# Patient Record
Sex: Male | Born: 1985 | Race: Black or African American | Hispanic: No | Marital: Single | Smoking: Former smoker
Health system: Southern US, Community
[De-identification: ages and names within clinical notes are randomized; demographics above are authoritative.]

## PROBLEM LIST (undated history)

## (undated) DIAGNOSIS — I10 Essential (primary) hypertension: Secondary | ICD-10-CM

## (undated) HISTORY — DX: Essential (primary) hypertension: I10

---
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
## 2007-11-25 ENCOUNTER — Emergency Department (HOSPITAL_COMMUNITY): Admission: EM | Admit: 2007-11-25 | Discharge: 2007-11-25 | Payer: Self-pay | Admitting: Emergency Medicine

## 2007-12-11 ENCOUNTER — Ambulatory Visit: Payer: Self-pay | Admitting: Cardiology

## 2007-12-22 ENCOUNTER — Encounter: Payer: Self-pay | Admitting: Cardiology

## 2007-12-22 ENCOUNTER — Ambulatory Visit: Payer: Self-pay

## 2008-08-30 ENCOUNTER — Emergency Department (HOSPITAL_COMMUNITY): Admission: EM | Admit: 2008-08-30 | Discharge: 2008-08-30 | Payer: Self-pay | Admitting: Emergency Medicine

## 2008-08-31 ENCOUNTER — Telehealth: Payer: Self-pay | Admitting: *Deleted

## 2008-10-18 ENCOUNTER — Emergency Department (HOSPITAL_COMMUNITY): Admission: EM | Admit: 2008-10-18 | Discharge: 2008-10-19 | Payer: Self-pay | Admitting: Emergency Medicine

## 2010-12-26 LAB — CBC
HCT: 44.6 % (ref 39.0–52.0)
Hemoglobin: 15.1 g/dL (ref 13.0–17.0)
MCHC: 34 g/dL (ref 30.0–36.0)
MCV: 93.5 fL (ref 78.0–100.0)
RDW: 12.7 % (ref 11.5–15.5)

## 2010-12-26 LAB — DIFFERENTIAL
Lymphs Abs: 0.4 10*3/uL — ABNORMAL LOW (ref 0.7–4.0)
Monocytes Relative: 4 % (ref 3–12)
Neutro Abs: 8.2 10*3/uL — ABNORMAL HIGH (ref 1.7–7.7)
Neutrophils Relative %: 92 % — ABNORMAL HIGH (ref 43–77)

## 2010-12-26 LAB — COMPREHENSIVE METABOLIC PANEL
BUN: 6 mg/dL (ref 6–23)
Calcium: 9.7 mg/dL (ref 8.4–10.5)
Creatinine, Ser: 0.79 mg/dL (ref 0.4–1.5)
GFR calc non Af Amer: >60 mL/min (ref >60)
Glucose, Bld: 98 mg/dL (ref 70–99)
Total Protein: 7 g/dL (ref 6.0–8.3)

## 2010-12-26 LAB — URINALYSIS, ROUTINE W REFLEX MICROSCOPIC
Ketones, ur: 15 mg/dL — AB
Leukocytes, UA: NEGATIVE
Nitrite: NEGATIVE
Protein, ur: NEGATIVE mg/dL
Urobilinogen, UA: 1 mg/dL (ref 0.0–1.0)
pH: 8 (ref 5.0–8.0)

## 2010-12-26 LAB — URINE MICROSCOPIC-ADD ON

## 2011-01-23 NOTE — Assessment & Plan Note (Signed)
[facility]                            CARDIOLOGY OFFICE NOTE   NAME:[name], [name]                    MRN:          [id number]  DATE:12/11/2007                            DOB:          1986/04/08    I was asked by Dr. Cathren Laine to consult on [name], a 25 year old  single black male, for chest pain and tachy palpitations.  He comes  today with his father.   He has had no previous cardiac history.  He developed a rapid heartbeat  after eating angry recently.  He then had some chest tightness  associated with it.   This happened the day that he was seen in the emergency room at [facility] a second time.  This was on November 25, 2007.  At this time, he had  no emotional issues going on.   In the emergency room, his vital signs were stable.  He had a negative  chest x-ray.  Negative drug screen, except for positive marijuana.  He  was referred to [place] for followup.   He used to play a quite a bit of ball and had no problems keeping up  with his peers.  He occasionally works out and has no shortness of  breath, tachy palpitations, presyncope, syncope, or chest pain.  He has  never been told he has a murmur.  There is no history of sudden cardiac  death in his family.   PAST MEDICAL HISTORY:  He has no history of dye allergies.  He is on no  medications.  He smokes about 6 cigarettes a day.  He denies any use of  any recreational drugs other than some occasional marijuana.  He does  not drink alcohol.   He has had no surgical history.   FAMILY HISTORY:  Really negative for premature coronary disease or  sudden cardiac death.   SOCIAL HISTORY:  He is unemployed.  He is single.  He comes with his dad  today.   REVIEW OF SYSTEMS:  Negative other than the HPI.  All systems were  reviewed.   PHYSICAL EXAMINATION:  VITAL SIGNS:  His blood pressure is 100/58, his  pulse is 57 and regular.  His EKG shows sinus bradycardia and  significant  sinus arrhythmia.  You can also hear this on auscultation.  His PR is 160 msec, QRS is 96 msec, QTC is 493 msec.  His height is 5  feet 8, and he weighs 144 pounds.  HEENT:  Normocephalic, atraumatic.  PERRL.  Extraocular movements  intact.  Sclerae are clear.  Facial symmetry is normal.  Dentition is  satisfactory.  NECK:  Supple.  Carotids upstrokes were equal bilaterally, without  bruits, no JVD.  Thyroid is not enlarged.  Trachea is midline.  LUNGS:  Clear.  HEART:  Reveals a nondisplaced PMI.  He has normal S1, S2.  He is thin  chested, so he has loud heart sounds.  S2 splits physiologically.  There  was no significant murmur appreciated.  ABDOMEN:  Soft.  Good bowel sounds.  No midline bruit.  No hepatomegaly.  EXTREMITIES:  No cyanosis, clubbing, or edema.  Pulses are intact.  NEUROLOGIC:  Intact.  SKIN:  Unremarkable.   ASSESSMENT AND PLAN:  I suspect [name] has had some sinus  tachycardia or some PACs associated with his two episodes.  His chest  discomfort was probably associated with this as well.  We need to rule  out hypertrophic obstructive cardiomyopathy or any other structural  heart disease.   We will obtain a 2D echocardiogram, and if this is negative or normal,  reassurance will be given.  I do not think any specific pharmacological  therapy needs to be given.     Thomas C. Daleen Squibb, MD, [facility]  Electronically Signed    TCW/MedQ  DD: 12/11/2007  DT: 12/12/2007  Job #: [id number]   cc:   Trudi Ida. Denton Lank, M.D.

## 2011-06-04 LAB — RAPID URINE DRUG SCREEN, HOSP PERFORMED
Amphetamines: NOT DETECTED
Barbiturates: NOT DETECTED
Cocaine: NOT DETECTED
Opiates: NOT DETECTED
Tetrahydrocannabinol: POSITIVE — AB

## 2012-03-28 ENCOUNTER — Emergency Department (HOSPITAL_COMMUNITY)
Admission: EM | Admit: 2012-03-28 | Discharge: 2012-03-28 | Disposition: A | Discharge status: Home / Self Care | Payer: Self-pay | Attending: Emergency Medicine | Admitting: Emergency Medicine

## 2012-03-28 ENCOUNTER — Encounter (HOSPITAL_COMMUNITY): Payer: Self-pay

## 2012-03-28 DIAGNOSIS — R109 Unspecified abdominal pain: Secondary | ICD-10-CM | POA: Insufficient documentation

## 2012-03-28 DIAGNOSIS — R1110 Vomiting, unspecified: Secondary | ICD-10-CM | POA: Insufficient documentation

## 2012-03-28 NOTE — ED Notes (Signed)
Pt here with abd pain after drinking last night, sts vomiting all day

## 2012-03-28 NOTE — ED Notes (Signed)
Chart check done, vitals rechecked

## 2014-03-19 ENCOUNTER — Emergency Department (HOSPITAL_COMMUNITY): Payer: Self-pay

## 2014-03-19 ENCOUNTER — Encounter (HOSPITAL_COMMUNITY): Payer: Self-pay | Admitting: Emergency Medicine

## 2014-03-19 ENCOUNTER — Emergency Department (HOSPITAL_COMMUNITY)
Admission: EM | Admit: 2014-03-19 | Discharge: 2014-03-19 | Disposition: A | Discharge status: Home / Self Care | Payer: Self-pay | Attending: Emergency Medicine | Admitting: Emergency Medicine

## 2014-03-19 DIAGNOSIS — F17200 Nicotine dependence, unspecified, uncomplicated: Secondary | ICD-10-CM | POA: Insufficient documentation

## 2014-03-19 DIAGNOSIS — R51 Headache: Secondary | ICD-10-CM | POA: Insufficient documentation

## 2014-03-19 DIAGNOSIS — L02419 Cutaneous abscess of limb, unspecified: Secondary | ICD-10-CM | POA: Insufficient documentation

## 2014-03-19 DIAGNOSIS — L03116 Cellulitis of left lower limb: Secondary | ICD-10-CM

## 2014-03-19 DIAGNOSIS — L03119 Cellulitis of unspecified part of limb: Principal | ICD-10-CM

## 2014-03-19 LAB — CBC WITH DIFFERENTIAL/PLATELET
Basophils Absolute: 0 10*3/uL (ref 0.0–0.1)
Basophils Relative: 0 % (ref 0–1)
EOS PCT: 0 % (ref 0–5)
Eosinophils Absolute: 0 10*3/uL (ref 0.0–0.7)
HCT: 39.4 % (ref 39.0–52.0)
HEMOGLOBIN: 13.4 g/dL (ref 13.0–17.0)
LYMPHS ABS: 1.2 10*3/uL (ref 0.7–4.0)
LYMPHS PCT: 7 % — AB (ref 12–46)
MCH: 30.9 pg (ref 26.0–34.0)
MCHC: 34 g/dL (ref 30.0–36.0)
MCV: 91 fL (ref 78.0–100.0)
Monocytes Absolute: 1.6 10*3/uL — ABNORMAL HIGH (ref 0.1–1.0)
Monocytes Relative: 9 % (ref 3–12)
NEUTROS PCT: 84 % — AB (ref 43–77)
Neutro Abs: 14.1 10*3/uL — ABNORMAL HIGH (ref 1.7–7.7)
PLATELETS: 196 10*3/uL (ref 150–400)
RBC: 4.33 MIL/uL (ref 4.22–5.81)
RDW: 12.7 % (ref 11.5–15.5)
WBC: 16.9 10*3/uL — AB (ref 4.0–10.5)

## 2014-03-19 LAB — BASIC METABOLIC PANEL
Anion gap: 13 (ref 5–15)
BUN: 7 mg/dL (ref 6–23)
CO2: 28 meq/L (ref 19–32)
Calcium: 10 mg/dL (ref 8.4–10.5)
Chloride: 99 mEq/L (ref 96–112)
Creatinine, Ser: 0.87 mg/dL (ref 0.50–1.35)
GFR calc Af Amer: >90 mL/min (ref >90)
GFR calc non Af Amer: >90 mL/min (ref >90)
GLUCOSE: 97 mg/dL (ref 70–99)
POTASSIUM: 3.8 meq/L (ref 3.7–5.3)
SODIUM: 140 meq/L (ref 137–147)

## 2014-03-19 MED ORDER — CEPHALEXIN 500 MG PO CAPS: 500.0000 mg | ORAL_CAPSULE | Freq: Four times a day (QID) | ORAL | Status: DC

## 2014-03-19 MED ORDER — LIDOCAINE HCL (PF) 1 % IJ SOLN: 5.0000 mL | Freq: Once | INTRAMUSCULAR | Status: DC

## 2014-03-19 MED ORDER — CEPHALEXIN 250 MG PO CAPS
500.0000 mg | ORAL_CAPSULE | Freq: Once | ORAL | Status: AC
  Administered 2014-03-19: 500 mg via ORAL
  Filled 2014-03-19: qty 2

## 2014-03-19 MED ORDER — DOXYCYCLINE HYCLATE 100 MG PO CAPS: 100.0000 mg | ORAL_CAPSULE | Freq: Two times a day (BID) | ORAL | Status: DC

## 2014-03-19 MED ORDER — OXYCODONE-ACETAMINOPHEN 5-325 MG PO TABS
2.0000 | ORAL_TABLET | Freq: Once | ORAL | Status: AC
  Administered 2014-03-19: 2 via ORAL
  Filled 2014-03-19 (×2): qty 2

## 2014-03-19 MED ORDER — DOXYCYCLINE HYCLATE 100 MG PO TABS
100.0000 mg | ORAL_TABLET | Freq: Once | ORAL | Status: AC
  Administered 2014-03-19: 100 mg via ORAL
  Filled 2014-03-19: qty 1

## 2014-03-19 MED ORDER — SULFAMETHOXAZOLE-TMP DS 800-160 MG PO TABS
2.0000 | ORAL_TABLET | Freq: Once | ORAL | Status: AC
Start: 2014-03-19 — End: 2014-03-19
  Administered 2014-03-19: 2 via ORAL
  Filled 2014-03-19: qty 2

## 2014-03-19 MED ORDER — OXYCODONE-ACETAMINOPHEN 5-325 MG PO TABS: 1.0000 | ORAL_TABLET | ORAL | Status: DC | PRN

## 2014-03-19 NOTE — ED Notes (Signed)
The tech cleaned and applied a dressing to the left sole of the foot. The tech has reported to the RN in charge.

## 2014-03-19 NOTE — Discharge Instructions (Signed)
Read the information below.  Use the prescribed medication as directed.  Please discuss all new medications with your pharmacist.  Do not take additional tylenol while taking the prescribed pain medication to avoid overdose.  You may return to the Emergency Department at any time for worsening condition or any new symptoms that concern you.  If you develop increased redness, swelling, pus draining from the wound, or fevers greater than 100.4 despite using the prescribed medication and/or ibuprofen, return to the ER immediately for a recheck.     Cellulitis Cellulitis is an infection of the skin and the tissue beneath it. The infected area is usually red and tender. Cellulitis occurs most often in the arms and lower legs.  CAUSES  Cellulitis is caused by bacteria that enter the skin through cracks or cuts in the skin. The most common types of bacteria that cause cellulitis are Staphylococcus and Streptococcus. SYMPTOMS   Redness and warmth.  Swelling.  Tenderness or pain.  Fever. DIAGNOSIS  Your caregiver can usually determine what is wrong based on a physical exam. Blood tests may also be done. TREATMENT  Treatment usually involves taking an antibiotic medicine. HOME CARE INSTRUCTIONS   Take your antibiotics as directed. Finish them even if you start to feel better.  Keep the infected arm or leg elevated to reduce swelling.  Apply a warm cloth to the affected area up to 4 times per day to relieve pain.  Only take over-the-counter or prescription medicines for pain, discomfort, or fever as directed by your caregiver.  Keep all follow-up appointments as directed by your caregiver. SEEK MEDICAL CARE IF:   You notice red streaks coming from the infected area.  Your red area gets larger or turns dark in color.  Your bone or joint underneath the infected area becomes painful after the skin has healed.  Your infection returns in the same area or another area.  You notice a swollen  bump in the infected area.  You develop new symptoms. SEEK IMMEDIATE MEDICAL CARE IF:   You have a fever.  You feel very sleepy.  You develop vomiting or diarrhea.  You have a general ill feeling (malaise) with muscle aches and pains. MAKE SURE YOU:   Understand these instructions.  Will watch your condition.  Will get help right away if you are not doing well or get worse. Document Released: 06/06/2005 Document Revised: 02/26/2012 Document Reviewed: 11/12/2011 [facility] Patient Information 2015 [place]Care, [place]LL[place]. This information is not intended to replace advice given to you by your health care provider. Make sure you discuss any questions you have with your health care provider.

## 2014-03-19 NOTE — ED Notes (Signed)
Declined W/C at D/C and was escorted to lobby by RN. 

## 2014-03-19 NOTE — ED Notes (Signed)
Patient states having pain in left leg for a few days. Pt states has blisters on both feet.

## 2014-03-19 NOTE — ED Provider Notes (Signed)
CSN: [id number]     Arrival date & time 03/19/14  1456 History  This chart was scribed for Eddie DredgeEmily Nechuma Boven, PA-C working with Glynn OctaveStephen Rancour, MD by Evon Slackerrance Branch, ED Scribe. This patient was seen in room TR11C/TR11C and the patient's care was started at 5:23 PM.     Chief Complaint  Patient presents with  . Leg Pain   Patient is a 28 y.o. male presenting with leg pain. The history is provided by the patient. No language interpreter was used.  Leg Pain Associated symptoms: fever    HPI Comments: [name] is a 28 y.o. male who presents to the Emergency Department complaining of left sided leg pain onset 2 days. Pain is over his anterior thigh.  Pain is 8/10 with walking. He states that there was no injury to the leg. He states he has associated fever and headache.  Has chronic problems with wounds on his feet that he "picks at" - noticed a red painful spot on his left foot 2-3 days ago. Pain is 8/10, constant, worse with palpation and ambulation. He states that the leg is also red in the thigh area that is new today. He states that he has applied Bengay with no relief. He states that he fell in the pool recently but didn't injure the area he is complaining about, hit the bottom of the pool with his left knee. He denies cough, dysuria, discharge, rhinorrhea, back pain, weakness or numbness of the extremities.  He was recently at the beach and a water park but denies known injury to his feet or stepping on anything.   He also complaining of abscess on his left foot. He states that is very painful, He states that he picks at his feet often. He staes there was no injury to the foot.   History reviewed. No pertinent past medical history. History reviewed. No pertinent past surgical history. History reviewed. No pertinent family history. History  Substance Use Topics  . Smoking status: Current Every Day Smoker  . Smokeless tobacco: Not on file  . Alcohol Use: Yes    Review of Systems   Constitutional: Positive for fever.  HENT: Negative for rhinorrhea and sore throat.   Respiratory: Negative for cough.   Genitourinary: Negative for dysuria and discharge.  Musculoskeletal: Positive for myalgias.  Neurological: Positive for headaches.  All other systems reviewed and are negative.  Allergies  Review of patient's allergies indicates no known allergies.  Home Medications   Prior to Admission medications   Medication Sig Start Date End Date Taking? Authorizing Provider  ibuprofen (ADVIL,MOTRIN) 800 MG tablet Take 800 mg by mouth every 8 (eight) hours as needed.   Yes Historical Provider, MD  Menthol, Topical Analgesic, (BENGAY EX) Apply 1 application topically daily as needed (leg pain).   Yes Historical Provider, MD   Triage Vitals: BP 124/67  Pulse 86  Temp(Src) 100.4 F (38 C) (Oral)  Ht 5\' 8"  (1.727 m)  Wt 165 lb (74.844 kg)  BMI 25.09 kg/m2  SpO2 100%  Physical Exam  Nursing note and vitals reviewed. Constitutional: He appears well-developed and well-nourished. No distress.  HENT:  Head: Normocephalic and atraumatic.  Neck: Neck supple.  Cardiovascular: Intact distal pulses.   Pulmonary/Chest: Effort normal.  Musculoskeletal:       Left lower leg: He exhibits no tenderness, no swelling and no edema.       Legs: Neurological: He is alert.  Skin: He is not diaphoretic.    ED Course  Procedures (including critical care time) DIAGNOSTIC STUDIES: Oxygen Saturation is 100% on RA, normal by my interpretation.    COORDINATION OF CARE:    Labs Review Labs Reviewed - No data to display  Imaging Review No results found.   EKG Interpretation None      7:42 PM Discussed pt with Dr Manus Gunning who will also see the patient.  INCISION AND DRAINAGE Performed by: [name] Consent: Verbal consent obtained. Risks and benefits: risks, benefits and alternatives were discussed Type: abscess  Body area: plantar aspect , left foot  Anesthesia: local  infiltration  Incision was made with a scalpel.  Local anesthetic: lidocaine 2% no epinephrine  Anesthetic total: 1 ml  Complexity: complex Blunt dissection to break up loculations  Drainage: bloody  Drainage amount: slight  Packing material: none  Patient tolerance: Patient tolerated the procedure well with no immediate complications.     MDM   Final diagnoses:  Cellulitis of left leg    Febrile but nontoxic patient with no known medication problems with apparent abscess over left foot with erythematous streak into medial ankle and erythema and tenderness over anterior thigh.  No calf tenderness.  WBC 16.9.  Fevers in ED.  Pt otherwise very healthy, young person.  Pt also seen by Dr Manus Gunning.  Pt prefers discharge home.  Will start keflex, doxycyline.  Pt will return to ED in two days for a recheck, sooner for worsening symptoms.  Discussed result, findings, treatment, and follow up  with patient.  Pt given return precautions.  Pt verbalizes understanding and agrees with plan.        I personally performed the services described in this documentation, which was scribed in my presence. The recorded information has been reviewed and is accurate.      Eddie Dredge, PA-C 03/19/14 2145

## 2014-03-20 NOTE — ED Provider Notes (Signed)
Medical screening examination/treatment/procedure(s) were conducted as a shared visit with non-physician practitioner(s) and myself.  I personally evaluated the patient during the encounter.  Erythema and induration to L medial foot. +2DP and PT pulse.  No erythema of lower leg, but streak of erythema to anterior thigh. D/w patient PO abx versus admission.  He wishes to try outpatient abx and recheck in 48 hours.   EKG Interpretation None       Glynn OctaveStephen Amarilis Belflower, MD 03/20/14 0105

## 2014-03-21 ENCOUNTER — Encounter (HOSPITAL_COMMUNITY): Payer: Self-pay | Admitting: Emergency Medicine

## 2014-03-21 ENCOUNTER — Emergency Department (HOSPITAL_COMMUNITY)
Admission: EM | Admit: 2014-03-21 | Discharge: 2014-03-21 | Disposition: A | Discharge status: Home / Self Care | Payer: Self-pay | Attending: Emergency Medicine | Admitting: Emergency Medicine

## 2014-03-21 DIAGNOSIS — Z5189 Encounter for other specified aftercare: Secondary | ICD-10-CM

## 2014-03-21 DIAGNOSIS — F17200 Nicotine dependence, unspecified, uncomplicated: Secondary | ICD-10-CM | POA: Insufficient documentation

## 2014-03-21 DIAGNOSIS — Z79899 Other long term (current) drug therapy: Secondary | ICD-10-CM | POA: Insufficient documentation

## 2014-03-21 DIAGNOSIS — Z4801 Encounter for change or removal of surgical wound dressing: Secondary | ICD-10-CM | POA: Insufficient documentation

## 2014-03-21 DIAGNOSIS — Z792 Long term (current) use of antibiotics: Secondary | ICD-10-CM | POA: Insufficient documentation

## 2014-03-21 NOTE — ED Notes (Signed)
Declined W/C at D/C and was escorted to lobby by RN. 

## 2014-03-21 NOTE — Discharge Instructions (Signed)
Continue antibiotics as directed and performing home wound care/dressing changes. Return to the ED for new or worsening symptoms.

## 2014-03-21 NOTE — ED Provider Notes (Signed)
CSN: [id number]     Arrival date & time 03/21/14  1200 History  This chart was scribed for non-physician practitioner, Sharilyn Sites, PA-C,working with Rolland Porter, MD, by Karle Plumber, ED Scribe.  This patient was seen in room TR11C/TR11C and the patient's care was started at 12:49 PM.  Chief Complaint  Patient presents with  . Wound Check   The history is provided by the patient. No language interpreter was used.   HPI Comments:  DAMEK ENDE is a 28 y.o. male who presents to the Emergency Department needing a recheck of an incision and drainage that was done here two days ago to his left medial foot. He reports mild pain while standing and subjective fever. He states the previous redness and streaking has resolved. He denies chills, or numbness of the foot.  States overall he is doing well.  History reviewed. No pertinent past medical history. History reviewed. No pertinent past surgical history. No family history on file. History  Substance Use Topics  . Smoking status: Current Every Day Smoker  . Smokeless tobacco: Not on file  . Alcohol Use: Yes    Review of Systems  Constitutional: Negative for chills.  Skin: Positive for wound.  Neurological: Negative for numbness.  All other systems reviewed and are negative.   Allergies  Review of patient's allergies indicates no known allergies.  Home Medications   Prior to Admission medications   Medication Sig Start Date End Date Taking? Authorizing Provider  cephALEXin (KEFLEX) 500 MG capsule Take 1 capsule (500 mg total) by mouth 4 (four) times daily. 03/19/14   Trixie Dredge, PA-C  doxycycline (VIBRAMYCIN) 100 MG capsule Take 1 capsule (100 mg total) by mouth 2 (two) times daily. One po bid x 7 days 03/19/14   Trixie Dredge, PA-C  ibuprofen (ADVIL,MOTRIN) 800 MG tablet Take 800 mg by mouth every 8 (eight) hours as needed.    Historical Provider, MD  Menthol, Topical Analgesic, (BENGAY EX) Apply 1 application topically daily as  needed (leg pain).    Historical Provider, MD  oxyCODONE-acetaminophen (PERCOCET/ROXICET) 5-325 MG per tablet Take 1-2 tablets by mouth every 4 (four) hours as needed for moderate pain or severe pain. 03/19/14   Trixie Dredge, PA-C   Triage Vitals: BP 121/68  Pulse 64  Temp(Src) 98.3 F (36.8 C) (Oral)  Resp 16  SpO2 100%  Physical Exam  Nursing note and vitals reviewed. Constitutional: He is oriented to person, place, and time. He appears well-developed and well-nourished.  HENT:  Head: Normocephalic and atraumatic.  Mouth/Throat: Oropharynx is clear and moist.  Eyes: Conjunctivae and EOM are normal. Pupils are equal, round, and reactive to light.  Neck: Normal range of motion.  Cardiovascular: Normal rate, regular rhythm and normal heart sounds.   Strong DP pulse.  Pulmonary/Chest: Effort normal and breath sounds normal.  Abdominal: Soft. Bowel sounds are normal.  Musculoskeletal: Normal range of motion.  Neurological: He is alert and oriented to person, place, and time.  Sensation intact.  Skin: Skin is warm and dry.  Left medial foot with small abscess that has been I & D. No surrounding erythema or cellulitic changes. No drainage. No streaking up leg or into foot.  Psychiatric: He has a normal mood and affect.    ED Course  Procedures (including critical care time) DIAGNOSTIC STUDIES: Oxygen Saturation is 100% on RA, normal by my interpretation.   COORDINATION OF CARE: 12:52 PM- Reassured pt that his wound was well-healing. Instructed him to continue antibiotics  as directed until finished. Pt verbalizes understanding and agrees to plan.  Medications - No data to display  Labs Review Labs Reviewed - No data to display  Imaging Review Dg Foot Complete Left  03/19/2014   CLINICAL DATA:  Medial foot pain and swelling.  No trauma.  EXAM: LEFT FOOT - COMPLETE 3+ VIEW  COMPARISON:  None.  FINDINGS: There is no evidence of fracture or dislocation. There is no evidence of  arthropathy or other focal bone abnormality. Soft tissues are unremarkable.  IMPRESSION: Negative.   Electronically Signed   By: Christiana PellantGretchen  Green M.D.   On: 03/19/2014 19:45     EKG Interpretation None      MDM   Final diagnoses:  Visit for wound check   Re-check of abscess s/p I&D 2 days ago.  Patient is afebrile and overall nontoxic appearing. The streaking in his left leg has resolved.  Incision site appears clean without surrounding erythema, warmth to touch, induration, or cellulitic changes. Leg remains NVI. Patient appears to be responding well to antibiotic treatment. Do not feel that repeat lab work or imaging indicated at this time. He is discharged home and instructed to continue antibiotics, warm compresses, and home wound care.  He will return here if symptoms worsen or if experience further problems.  Discussed plan with patient, he/she acknowledged understanding and agreed with plan of care.  Return precautions given for new or worsening symptoms.  I personally performed the services described in this documentation, which was scribed in my presence. The recorded information has been reviewed and is accurate.    Garlon HatchetLisa M Ria Redcay, PA-C 03/21/14 1459

## 2014-03-21 NOTE — ED Notes (Signed)
Pt. Stated, here for a re-check of my left foot, I had an infection .

## 2014-03-29 NOTE — ED Provider Notes (Signed)
Medical screening examination/treatment/procedure(s) were performed by non-physician practitioner and as supervising physician I was immediately available for consultation/collaboration.   EKG Interpretation None        Jayen Bromwell, MD 03/29/14 1448 

## 2014-11-23 ENCOUNTER — Emergency Department (HOSPITAL_COMMUNITY)
Admission: EM | Admit: 2014-11-23 | Discharge: 2014-11-24 | Disposition: A | Discharge status: Home / Self Care | Payer: Self-pay | Attending: Emergency Medicine | Admitting: Emergency Medicine

## 2014-11-23 ENCOUNTER — Encounter (HOSPITAL_COMMUNITY): Payer: Self-pay | Admitting: Adult Health

## 2014-11-23 ENCOUNTER — Emergency Department (HOSPITAL_COMMUNITY): Payer: Self-pay

## 2014-11-23 DIAGNOSIS — J029 Acute pharyngitis, unspecified: Secondary | ICD-10-CM

## 2014-11-23 DIAGNOSIS — R05 Cough: Secondary | ICD-10-CM

## 2014-11-23 DIAGNOSIS — J111 Influenza due to unidentified influenza virus with other respiratory manifestations: Secondary | ICD-10-CM | POA: Insufficient documentation

## 2014-11-23 DIAGNOSIS — Z720 Tobacco use: Secondary | ICD-10-CM | POA: Insufficient documentation

## 2014-11-23 DIAGNOSIS — Z792 Long term (current) use of antibiotics: Secondary | ICD-10-CM | POA: Insufficient documentation

## 2014-11-23 DIAGNOSIS — R059 Cough, unspecified: Secondary | ICD-10-CM

## 2014-11-23 LAB — CBC WITH DIFFERENTIAL/PLATELET
BASOS ABS: 0 10*3/uL (ref 0.0–0.1)
BASOS PCT: 0 % (ref 0–1)
EOS ABS: 0 10*3/uL (ref 0.0–0.7)
EOS PCT: 0 % (ref 0–5)
HCT: 41.1 % (ref 39.0–52.0)
Hemoglobin: 13.8 g/dL (ref 13.0–17.0)
LYMPHS PCT: 7 % — AB (ref 12–46)
Lymphs Abs: 0.7 10*3/uL (ref 0.7–4.0)
MCH: 30.3 pg (ref 26.0–34.0)
MCHC: 33.6 g/dL (ref 30.0–36.0)
MCV: 90.1 fL (ref 78.0–100.0)
MONO ABS: 1 10*3/uL (ref 0.1–1.0)
Monocytes Relative: 11 % (ref 3–12)
Neutro Abs: 7.4 10*3/uL (ref 1.7–7.7)
Neutrophils Relative %: 82 % — ABNORMAL HIGH (ref 43–77)
Platelets: 202 10*3/uL (ref 150–400)
RBC: 4.56 MIL/uL (ref 4.22–5.81)
RDW: 12.6 % (ref 11.5–15.5)
WBC: 9 10*3/uL (ref 4.0–10.5)

## 2014-11-23 LAB — I-STAT CG4 LACTIC ACID, ED: Lactic Acid, Venous: 1.44 mmol/L (ref 0.5–2.0)

## 2014-11-23 LAB — RAPID STREP SCREEN (MED CTR MEBANE ONLY): STREPTOCOCCUS, GROUP A SCREEN (DIRECT): NEGATIVE

## 2014-11-23 MED ORDER — ACETAMINOPHEN 325 MG PO TABS
650.0000 mg | ORAL_TABLET | Freq: Four times a day (QID) | ORAL | Status: DC | PRN
  Administered 2014-11-23: 650 mg via ORAL

## 2014-11-23 MED ORDER — ACETAMINOPHEN 325 MG PO TABS
ORAL_TABLET | ORAL | Status: AC
  Filled 2014-11-23: qty 2

## 2014-11-23 NOTE — ED Notes (Signed)
Presents with 24 hours of generalized body aches, sore throat, fever and cough. Temp of 102.5. Endorses bilateral leg pain and weakness.

## 2014-11-24 LAB — COMPREHENSIVE METABOLIC PANEL
ALBUMIN: 4.4 g/dL (ref 3.5–5.2)
ALK PHOS: 83 U/L (ref 39–117)
ALT: 29 U/L (ref 0–53)
AST: 27 U/L (ref 0–37)
Anion gap: 5 (ref 5–15)
BUN: <5 mg/dL — ABNORMAL LOW (ref 6–23)
CO2: 30 mmol/L (ref 19–32)
Calcium: 9.8 mg/dL (ref 8.4–10.5)
Chloride: 103 mmol/L (ref 96–112)
Creatinine, Ser: 1.04 mg/dL (ref 0.50–1.35)
GFR calc Af Amer: >90 mL/min (ref >90)
GFR calc non Af Amer: >90 mL/min (ref >90)
Glucose, Bld: 99 mg/dL (ref 70–99)
POTASSIUM: 3.4 mmol/L — AB (ref 3.5–5.1)
SODIUM: 138 mmol/L (ref 135–145)
TOTAL PROTEIN: 7.3 g/dL (ref 6.0–8.3)
Total Bilirubin: 1.4 mg/dL — ABNORMAL HIGH (ref 0.3–1.2)

## 2014-11-24 LAB — CK: CK TOTAL: 263 U/L — AB (ref 7–232)

## 2014-11-24 MED ORDER — IBUPROFEN 800 MG PO TABS: 800.0000 mg | ORAL_TABLET | Freq: Three times a day (TID) | ORAL | Status: DC | PRN

## 2014-11-24 MED ORDER — IBUPROFEN 800 MG PO TABS
800.0000 mg | ORAL_TABLET | Freq: Once | ORAL | Status: AC
  Administered 2014-11-24: 800 mg via ORAL
  Filled 2014-11-24: qty 1

## 2014-11-24 NOTE — Discharge Instructions (Signed)
Pharyngitis [name], continue to take motrin as needed for pain and tylenol for fever.  Follow up with a primary physician within 3 days for repeat check.  If symptoms worsen, come back to the ED immediately. Thank you. Pharyngitis is a sore throat (pharynx). There is redness, pain, and swelling of your throat. HOME CARE   Drink enough fluids to keep your pee (urine) clear or pale yellow.  Only take medicine as told by your doctor.  You may get sick again if you do not take medicine as told. Finish your medicines, even if you start to feel better.  Do not take aspirin.  Rest.  Rinse your mouth (gargle) with salt water ( tsp of salt per 1 qt of water) every 1-2 hours. This will help the pain.  If you are not at risk for choking, you can suck on hard candy or sore throat lozenges. GET HELP IF:  You have large, tender lumps on your neck.  You have a rash.  You cough up green, yellow-brown, or bloody spit. GET HELP RIGHT AWAY IF:   You have a stiff neck.  You drool or cannot swallow liquids.  You throw up (vomit) or are not able to keep medicine or liquids down.  You have very bad pain that does not go away with medicine.  You have problems breathing (not from a stuffy nose). MAKE SURE YOU:   Understand these instructions.  Will watch your condition.  Will get help right away if you are not doing well or get worse. Document Released: 02/13/2008 Document Revised: 06/17/2013 Document Reviewed: 05/04/2013 [facility] Patient Information 2015 [place]Care, [place]LLC. This information is not intended to replace advice given to you by your health care provider. Make sure you discuss any questions you have with your health care provider. Influenza Influenza (flu) is an infection in the mouth, nose, and throat (respiratory tract) caused by a virus. The flu can make you feel very ill. Influenza spreads easily from person to person (contagious).  HOME CARE   Only take medicines as told  by your doctor.  Use a cool mist humidifier to make breathing easier.  Get plenty of rest until your fever goes away. This usually takes 3 to 4 days.  Drink enough fluids to keep your pee (urine) clear or pale yellow.  Cover your mouth and nose when you cough or sneeze.  Wash your hands well to avoid spreading the flu.  Stay home from work or school until your fever has been gone for at least 1 full day.  Get a flu shot every year. GET HELP RIGHT AWAY IF:   You have trouble breathing or feel short of breath.  Your skin or nails turn blue.  You have severe neck pain or stiffness.  You have a severe headache, facial pain, or earache.  Your fever gets worse or keeps coming back.  You feel sick to your stomach (nauseous), throw up (vomit), or have watery poop (diarrhea).  You have chest pain.  You have a deep cough that gets worse, or you cough up more thick spit (mucus). MAKE SURE YOU:   Understand these instructions.  Will watch your condition.  Will get help right away if you are not doing well or get worse. Document Released: 06/05/2008 Document Revised: 01/11/2014 Document Reviewed: 11/26/2011 [facility] LPExitCare Patient Information 2015 [place]Exit[employer], [place]LLC. This information is not intended to replace advice given to you by your health care provider. Make sure you discuss any questions you have  with your health care provider. ° °

## 2014-11-24 NOTE — ED Provider Notes (Signed)
CSN: [id number]     Arrival date & time 11/23/14  2137 History  This chart was scribed for Tomasita Crumble, MD by Bronson Curb, ED Scribe. This patient was seen in room B15C/B15C and the patient's care was started at 12:15 AM.   Chief Complaint  Patient presents with  . Fever  . Sore Throat  . Generalized Body Aches    The history is provided by the patient. No language interpreter was used.     HPI Comments: [name] is a 29 y.o. male, with no significant medical history, who presents to the Emergency Department complaining of fever (triage temp 102.5 F) and constant sore throat, with associated chills, productive cough, and BLE pain, that began 9 hours ago. Patient states he woke yesterday morning (11/23/14) with initial onset of cough. He states he proceeded to go to the gym, ran 2 laps (less that 1 mile) and notes worsening symptoms with BLE pain. He states he exercises regularly and has never experienced pain to BLE while doing so. He states he is unsure if his symptoms are related to this. He reports sick contacts with son and girlfriend. He denies nausea, vomiting, diarrhea. Patient has not had the flu vaccine.  History reviewed. No pertinent past medical history. History reviewed. No pertinent past surgical history. History reviewed. No pertinent family history. History  Substance Use Topics  . Smoking status: Current Every Day Smoker  . Smokeless tobacco: Not on file  . Alcohol Use: Yes    Review of Systems  A complete 10 system review of systems was obtained and all systems are negative except as noted in the HPI and PMH.    Allergies  Review of patient's allergies indicates no known allergies.  Home Medications   Prior to Admission medications   Medication Sig Start Date End Date Taking? Authorizing Provider  cephALEXin (KEFLEX) 500 MG capsule Take 1 capsule (500 mg total) by mouth 4 (four) times daily. 03/19/14   Trixie Dredge, PA-C  doxycycline (VIBRAMYCIN) 100  MG capsule Take 1 capsule (100 mg total) by mouth 2 (two) times daily. One po bid x 7 days 03/19/14   Trixie Dredge, PA-C  ibuprofen (ADVIL,MOTRIN) 800 MG tablet Take 800 mg by mouth every 8 (eight) hours as needed.    Historical Provider, MD  Menthol, Topical Analgesic, (BENGAY EX) Apply 1 application topically daily as needed (leg pain).    Historical Provider, MD  oxyCODONE-acetaminophen (PERCOCET/ROXICET) 5-325 MG per tablet Take 1-2 tablets by mouth every 4 (four) hours as needed for moderate pain or severe pain. 03/19/14   Trixie Dredge, PA-C   Triage Vitals: BP 128/63 mmHg  Pulse 91  Temp(Src) 102.5 F (39.2 C) (Oral)  Resp 20  Ht  (1.727 m)  Wt 170 lb (77.111 kg)  BMI 25.85 kg/m2  SpO2 100%  Physical Exam  Constitutional: He is oriented to person, place, and time. Vital signs are normal. He appears well-developed and well-nourished.  Non-toxic appearance. He does not appear ill. No distress.  HENT:  Head: Normocephalic and atraumatic.  Nose: Nose normal.  Mouth/Throat: Oropharynx is clear and moist. No oropharyngeal exudate or posterior oropharyngeal erythema.  Tactile fever. No erythema, no exudate. No tonsillar swelling.  Eyes: Conjunctivae and EOM are normal. Pupils are equal, round, and reactive to light. No scleral icterus.  Neck: Normal range of motion. Neck supple. No tracheal deviation, no edema, no erythema and normal range of motion present. No thyroid mass and no thyromegaly present.  Cardiovascular: Normal rate, regular rhythm, S1 normal, S2 normal, normal heart sounds, intact distal pulses and normal pulses.  Exam reveals no gallop and no friction rub.   No murmur heard. Pulses:      Radial pulses are 2+ on the right side, and 2+ on the left side.       Dorsalis pedis pulses are 2+ on the right side, and 2+ on the left side.  Pulmonary/Chest: Effort normal and breath sounds normal. No respiratory distress. He has no wheezes. He has no rhonchi. He has no rales.   Abdominal: Soft. Normal appearance and bowel sounds are normal. He exhibits no distension, no ascites and no mass. There is no hepatosplenomegaly. There is no tenderness. There is no rebound, no guarding and no CVA tenderness.  Musculoskeletal: Normal range of motion. He exhibits no edema or tenderness.  Lymphadenopathy:    He has no cervical adenopathy.  Neurological: He is alert and oriented to person, place, and time. He has normal strength. No cranial nerve deficit or sensory deficit.  Skin: Skin is warm, dry and intact. No petechiae and no rash noted. He is not diaphoretic. No erythema. No pallor.  Psychiatric: He has a normal mood and affect. His behavior is normal. Judgment normal.  Nursing note and vitals reviewed.   ED Course  Procedures (including critical care time)  DIAGNOSTIC STUDIES: Oxygen Saturation is 100% on room air, normal by my interpretation.    COORDINATION OF CARE: At 0020 Discussed treatment plan with patient. Patient agrees.   Labs Review Labs Reviewed  CBC WITH DIFFERENTIAL/PLATELET - Abnormal; Notable for the following:    Neutrophils Relative % 82 (*)    Lymphocytes Relative 7 (*)    All other components within normal limits  COMPREHENSIVE METABOLIC PANEL - Abnormal; Notable for the following:    Potassium 3.4 (*)    BUN <5 (*)    Total Bilirubin 1.4 (*)    All other components within normal limits  CK - Abnormal; Notable for the following:    Total CK 263 (*)    All other components within normal limits  RAPID STREP SCREEN  CULTURE, GROUP A STREP  I-STAT CG4 LACTIC ACID, ED    Imaging Review Dg Chest 2 View  11/23/2014   CLINICAL DATA:  Cough and sore throat  EXAM: CHEST  2 VIEW  COMPARISON:  11/25/2007  FINDINGS: The heart size and mediastinal contours are within normal limits. Both lungs are clear. The visualized skeletal structures are unremarkable.  IMPRESSION: No active cardiopulmonary disease.   Electronically Signed   By: Ellery Plunkaniel R  Mitchell M.D.   On: 11/23/2014 23:09     EKG Interpretation None      MDM   Final diagnoses:  Cough    Patient presents emergency department for sore throat, body aches, fever and coughing. His history is consistent with a viral pharyngitis and viral upper respiratory tract infection. I explained to the patient that influenza was a possibility. He has had sick contacts and his family members at home. He was given Tylenol and Motrin emergency department for fever and pain. His fever has resolved. His CK is elevated at 263. Patient states he did work out today and this is a mild elevation. His vital signs continued to stay within his normal limits and he is safe for discharge with primary care follow-up within 3 days.  I personally performed the services described in this documentation, which was scribed in my presence. The recorded information has  been reviewed and is accurate.    Tomasita Crumble, MD 11/24/14 [phone]

## 2014-11-26 LAB — CULTURE, GROUP A STREP: STREP A CULTURE: NEGATIVE

## 2015-05-21 ENCOUNTER — Encounter (HOSPITAL_COMMUNITY): Payer: Self-pay | Admitting: *Deleted

## 2015-05-21 ENCOUNTER — Emergency Department (INDEPENDENT_AMBULATORY_CARE_PROVIDER_SITE_OTHER)
Admission: EM | Admit: 2015-05-21 | Discharge: 2015-05-21 | Disposition: A | Discharge status: Home / Self Care | Payer: Self-pay | Source: Home / Self Care | Attending: Family Medicine | Admitting: Family Medicine

## 2015-05-21 DIAGNOSIS — S00212A Abrasion of left eyelid and periocular area, initial encounter: Secondary | ICD-10-CM

## 2015-05-21 MED ORDER — TOBRAMYCIN 0.3 % OP OINT
TOPICAL_OINTMENT | Freq: Once | OPHTHALMIC | Status: AC
  Administered 2015-05-21: 16:00:00 via OPHTHALMIC

## 2015-05-21 MED ORDER — TOBRAMYCIN 0.3 % OP OINT
TOPICAL_OINTMENT | OPHTHALMIC | Status: AC
  Filled 2015-05-21: qty 3.5

## 2015-05-21 NOTE — ED Provider Notes (Addendum)
CSN: [id number]     Arrival date & time 05/21/15  1338 History   First MD Initiated Contact with Patient 05/21/15 1504     Chief Complaint  Patient presents with  . Eye Injury   (Consider location/radiation/quality/duration/timing/severity/associated sxs/prior Treatment) Patient is a 29 y.o. male presenting with eye injury. The history is provided by the patient.  Eye Injury This is a new problem. The current episode started 1 to 2 hours ago. The problem has been gradually improving. Associated symptoms comments: Scratched left lower lid with object being held in hand, no ocular sx.Marland Kitchen    History reviewed. No pertinent past medical history. History reviewed. No pertinent past surgical history. History reviewed. No pertinent family history. Social History  Substance Use Topics  . Smoking status: Current Every Day Smoker  . Smokeless tobacco: None  . Alcohol Use: Yes    Review of Systems  Constitutional: Negative.   HENT: Negative.   Eyes: Positive for pain. Negative for photophobia, discharge, redness, itching and visual disturbance.  All other systems reviewed and are negative.   Allergies  Review of patient's allergies indicates no known allergies.  Home Medications   Prior to Admission medications   Medication Sig Start Date End Date Taking? Authorizing Provider  cephALEXin (KEFLEX) 500 MG capsule Take 1 capsule (500 mg total) by mouth 4 (four) times daily. Patient not taking: Reported on 11/24/2014 03/19/14   Trixie Dredge, PA-C  doxycycline (VIBRAMYCIN) 100 MG capsule Take 1 capsule (100 mg total) by mouth 2 (two) times daily. One po bid x 7 days Patient not taking: Reported on 11/24/2014 03/19/14   Trixie Dredge, PA-C  ibuprofen (ADVIL,MOTRIN) 800 MG tablet Take 1 tablet (800 mg total) by mouth every 8 (eight) hours as needed for fever or moderate pain. 11/24/14   Tomasita Crumble, MD  oxyCODONE-acetaminophen (PERCOCET/ROXICET) 5-325 MG per tablet Take 1-2 tablets by mouth every 4 (four)  hours as needed for moderate pain or severe pain. Patient not taking: Reported on 11/24/2014 03/19/14   Trixie Dredge, PA-C   Meds Ordered and Administered this Visit   Medications  tobramycin (TOBREX) 0.3 % ophthalmic ointment (not administered)    BP 132/64 mmHg  Pulse 55  Temp(Src) 97.9 F (36.6 C) (Oral)  Resp 18  SpO2 100% No data found.   Physical Exam  Constitutional: He is oriented to person, place, and time. He appears well-developed and well-nourished.  Eyes: Conjunctivae and EOM are normal. Pupils are equal, round, and reactive to light. Right eye exhibits no discharge. Left eye exhibits no discharge.    Neck: Normal range of motion. Neck supple.  Lymphadenopathy:    He has no cervical adenopathy.  Neurological: He is alert and oriented to person, place, and time.  Skin: Skin is warm and dry.  Nursing note and vitals reviewed.   ED Course  Procedures (including critical care time)  Labs Review Labs Reviewed - No data to display  Imaging Review No results found.   Visual Acuity Review  Right Eye Distance: 20/50 w/o corrections Left Eye Distance: 20/50 w/o corrections Bilateral Distance: 20/40 w/o corrections  Right Eye Near:   Left Eye Near:    Bilateral Near:         MDM   1. Eyelid abrasion, left, initial encounter        Linna Hoff, MD 05/21/15 1524  Linna Hoff, MD 05/21/15 [phone]

## 2015-05-21 NOTE — ED Notes (Signed)
Pt  Reports  He  Scratched   His  l   Eye   Today     Perhaps     With  His  Fingernail      Today     He  Reports  It    Burns    Some  And  Initially     Had some  Blurred vision

## 2015-05-21 NOTE — Discharge Instructions (Signed)
Warm soak to eyelid then medicine 3 times a day for 2-3 days.

## 2016-02-05 IMAGING — DX DG CHEST 2V
2 series · 2 of 2 positions shown · non-contrast
Comparison: 11/25/2007

CLINICAL DATA: Cough and sore throat

EXAM:
CHEST  2 VIEW

[chest pa]
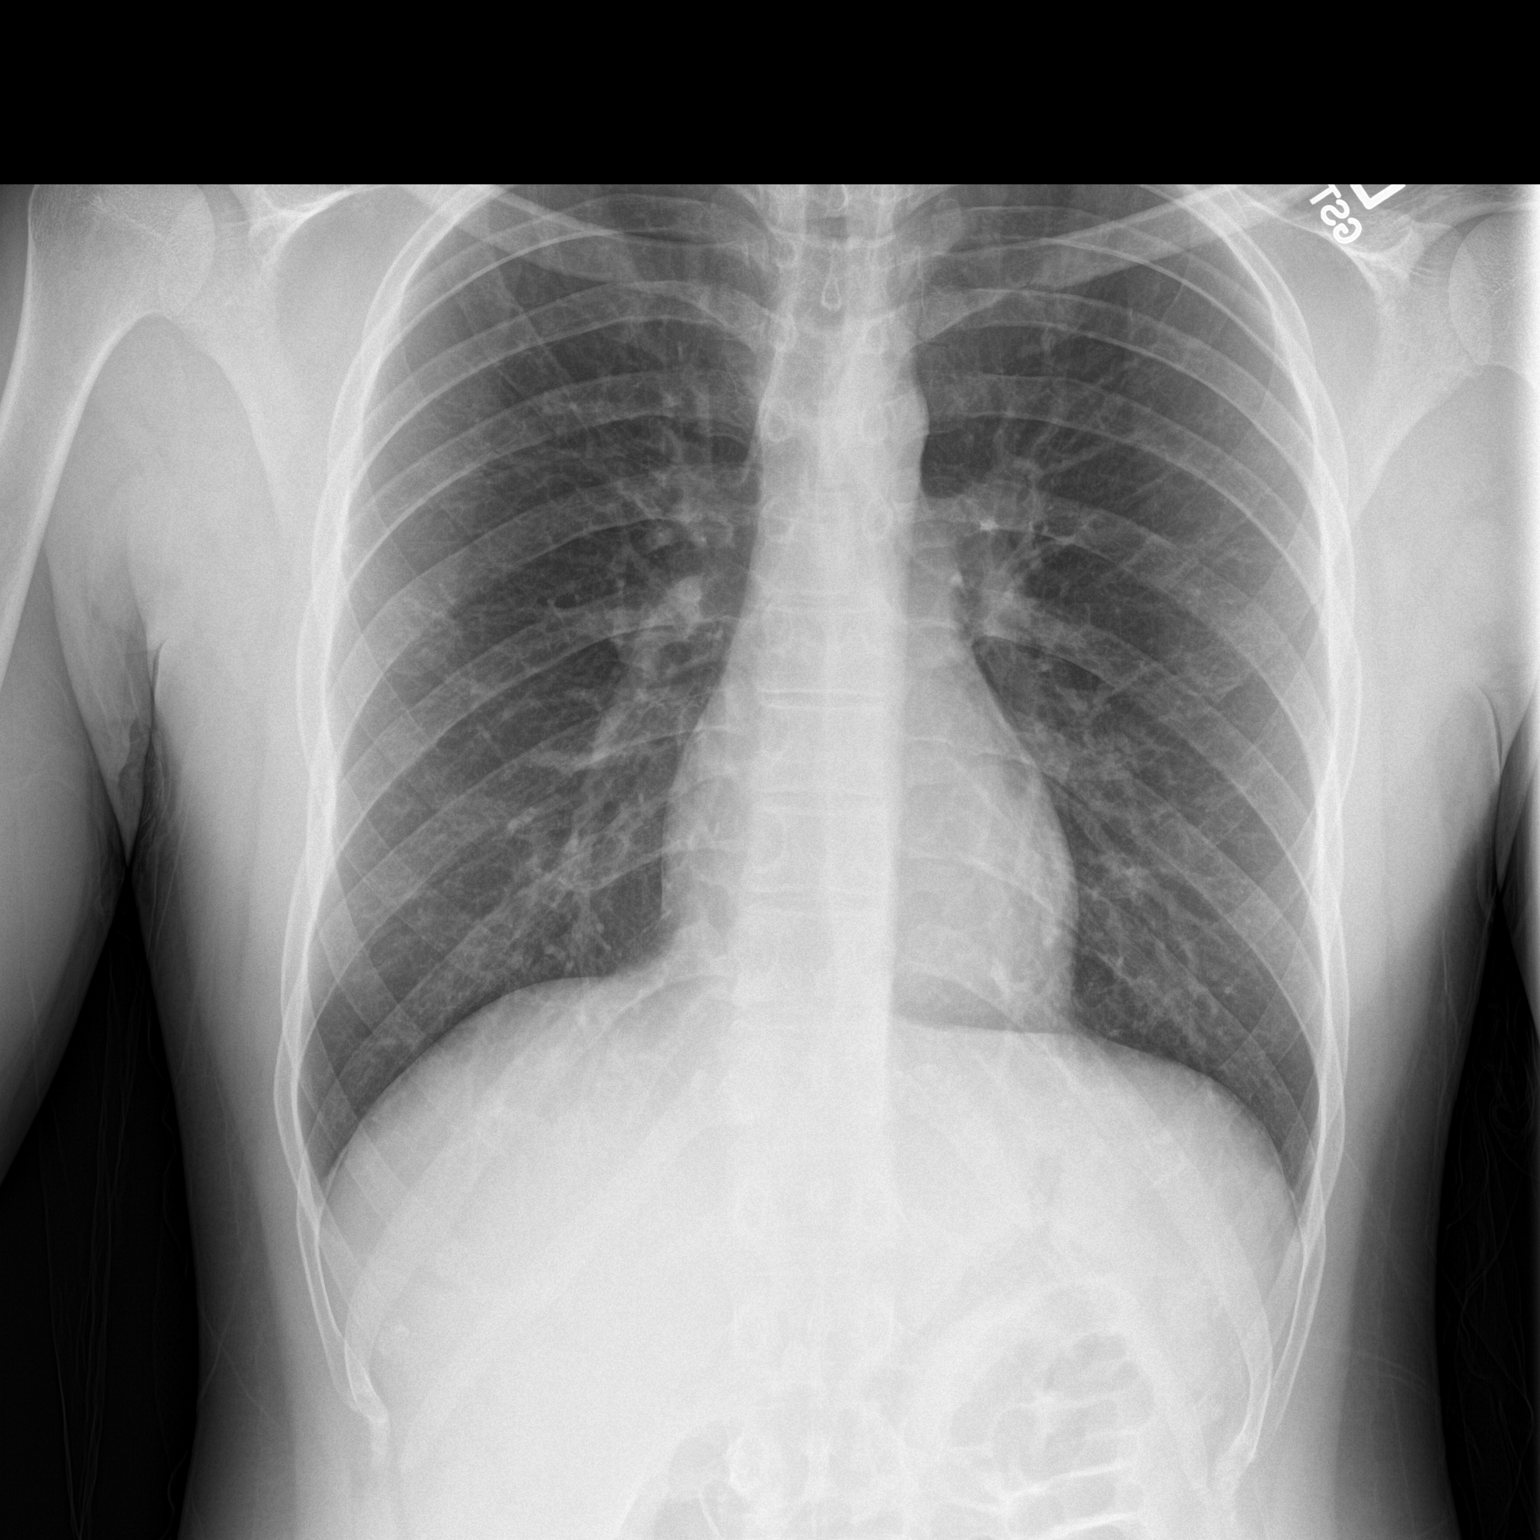

[chest lat]
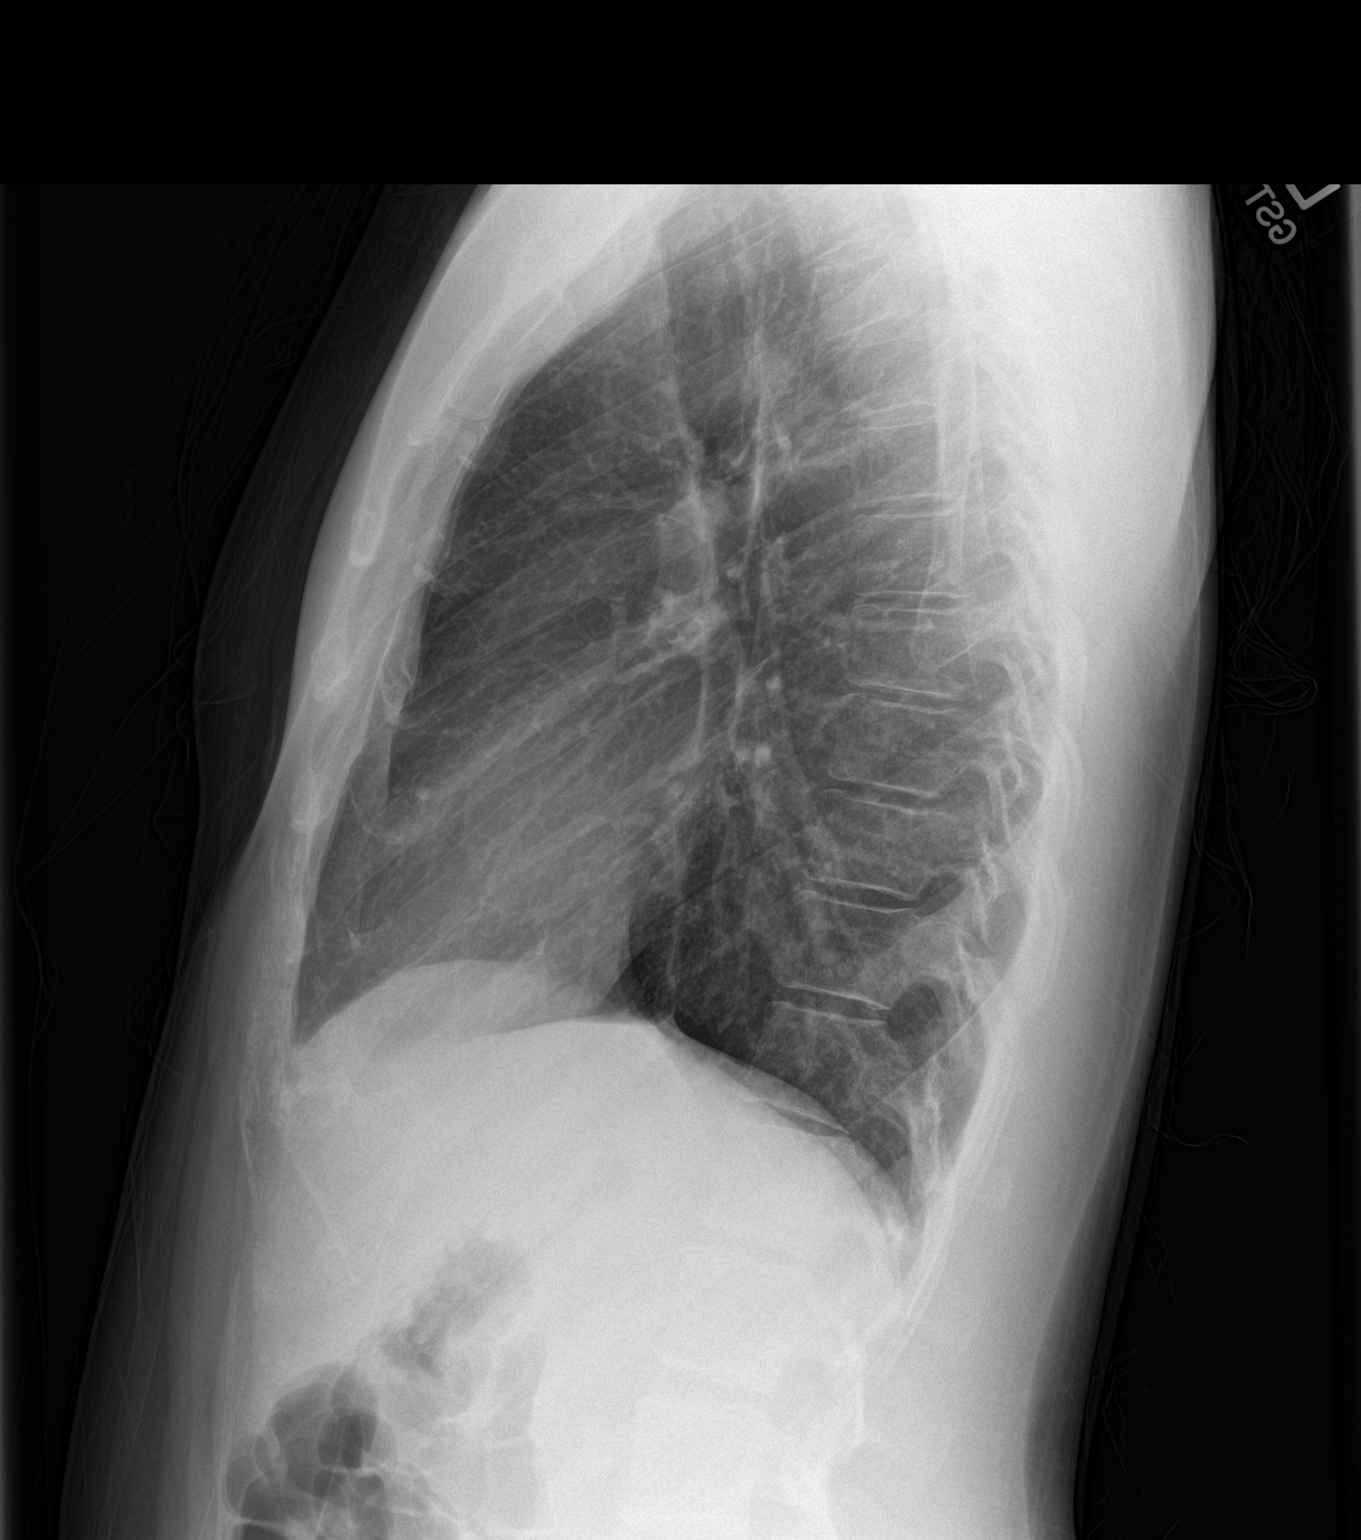

[2 of 2 positions shown; findings below may reference images not displayed]

FINDINGS: The heart size and mediastinal contours are within normal limits.
Both lungs are clear. The visualized skeletal structures are
unremarkable.
IMPRESSION: No active cardiopulmonary disease.

## 2017-04-16 ENCOUNTER — Emergency Department (HOSPITAL_COMMUNITY)
Admission: EM | Admit: 2017-04-16 | Discharge: 2017-04-16 | Disposition: A | Discharge status: Home / Self Care | Payer: Self-pay | Attending: Emergency Medicine | Admitting: Emergency Medicine

## 2017-04-16 ENCOUNTER — Encounter (HOSPITAL_COMMUNITY): Payer: Self-pay | Admitting: Emergency Medicine

## 2017-04-16 ENCOUNTER — Emergency Department (HOSPITAL_COMMUNITY): Payer: Self-pay

## 2017-04-16 DIAGNOSIS — F17200 Nicotine dependence, unspecified, uncomplicated: Secondary | ICD-10-CM | POA: Insufficient documentation

## 2017-04-16 DIAGNOSIS — B349 Viral infection, unspecified: Secondary | ICD-10-CM | POA: Insufficient documentation

## 2017-04-16 LAB — CBC
HEMATOCRIT: 44.5 % (ref 39.0–52.0)
Hemoglobin: 15.1 g/dL (ref 13.0–17.0)
MCH: 30.7 pg (ref 26.0–34.0)
MCHC: 33.9 g/dL (ref 30.0–36.0)
MCV: 90.4 fL (ref 78.0–100.0)
PLATELETS: 221 10*3/uL (ref 150–400)
RBC: 4.92 MIL/uL (ref 4.22–5.81)
RDW: 12.7 % (ref 11.5–15.5)
WBC: 6.3 10*3/uL (ref 4.0–10.5)

## 2017-04-16 LAB — BASIC METABOLIC PANEL
Anion gap: 9 (ref 5–15)
BUN: 8 mg/dL (ref 6–20)
CO2: 28 mmol/L (ref 22–32)
CREATININE: 1.06 mg/dL (ref 0.61–1.24)
Calcium: 9.9 mg/dL (ref 8.9–10.3)
Chloride: 104 mmol/L (ref 101–111)
GFR calc Af Amer: >60 mL/min (ref >60)
Glucose, Bld: 100 mg/dL — ABNORMAL HIGH (ref 65–99)
POTASSIUM: 3.8 mmol/L (ref 3.5–5.1)
SODIUM: 141 mmol/L (ref 135–145)

## 2017-04-16 LAB — I-STAT TROPONIN, ED: Troponin i, poc: 0 ng/mL (ref 0.00–0.08)

## 2017-04-16 NOTE — ED Provider Notes (Signed)
[facility]-EMERGENCY DEPT Provider Note   CSN: [id number] Arrival date & time: 04/16/17  1124     History   Chief Complaint Chief Complaint  Patient presents with  . Sore Throat  . Chest Pain    HPI   Blood pressure 124/83, pulse (!) 53, temperature 98.3 F (36.8 C), temperature source Oral, resp. rate 20, SpO2 96 %.  [name] is a 31 y.o. male complaining of Throat discomfort, left ear pain, diarrhea, cough with pleuritic chest pain onset yesterday. He denies fevers, chills, nausea, vomiting. He states he has a positive sick contact in that his young daughter is also sick with similar symptoms. He is eating and drinking normally.   History reviewed. No pertinent past medical history.  There are no active problems to display for this patient.   History reviewed. No pertinent surgical history.     Home Medications    Prior to Admission medications   Medication Sig Start Date End Date Taking? Authorizing Provider  cephALEXin (KEFLEX) 500 MG capsule Take 1 capsule (500 mg total) by mouth 4 (four) times daily. Patient not taking: Reported on 11/24/2014 03/19/14   Trixie DredgeWest, Emily, PA-C  doxycycline (VIBRAMYCIN) 100 MG capsule Take 1 capsule (100 mg total) by mouth 2 (two) times daily. One po bid x 7 days Patient not taking: Reported on 11/24/2014 03/19/14   Trixie DredgeWest, Emily, PA-C  ibuprofen (ADVIL,MOTRIN) 800 MG tablet Take 1 tablet (800 mg total) by mouth every 8 (eight) hours as needed for fever or moderate pain. 11/24/14   Tomasita Crumbleni, Adeleke, MD  oxyCODONE-acetaminophen (PERCOCET/ROXICET) 5-325 MG per tablet Take 1-2 tablets by mouth every 4 (four) hours as needed for moderate pain or severe pain. Patient not taking: Reported on 11/24/2014 03/19/14   Trixie DredgeWest, Emily, PA-C    Family History No family history on file.  Social History Social History  Substance Use Topics  . Smoking status: Current Every Day Smoker  . Smokeless tobacco: Never Used  . Alcohol use Yes     Allergies     Patient has no known allergies.   Review of Systems Review of Systems  A complete review of systems was obtained and all systems are negative except as noted in the HPI and PMH.    Physical Exam Updated Vital Signs BP 124/83 (BP Location: Right Arm)   Pulse (!) 53   Temp 98.3 F (36.8 C) (Oral)   Resp 20   SpO2 96%   Physical Exam  Constitutional: He appears well-developed and well-nourished.  HENT:  Head: Normocephalic.  Right Ear: External ear normal.  Left Ear: External ear normal.  Mouth/Throat: Oropharynx is clear and moist. No oropharyngeal exudate.  No drooling or stridor. Posterior pharynx mildly erythematous no significant tonsillar hypertrophy. No exudate. Soft palate rises symmetrically. No TTP or induration under tongue.   No tenderness to palpation of frontal or bilateral maxillary sinuses.  Mild mucosal edema in the nares with scant rhinorrhea.  Bilateral tympanic membranes with normal architecture and good light reflex.    Eyes: Pupils are equal, round, and reactive to light. Conjunctivae and EOM are normal.  Neck: Normal range of motion. Neck supple.  Cardiovascular: Normal rate and regular rhythm.   Pulmonary/Chest: Effort normal and breath sounds normal. No stridor. No respiratory distress. He has no wheezes. He has no rales. He exhibits no tenderness.  Abdominal: Soft. There is no tenderness. There is no rebound and no guarding.  Nursing note and vitals reviewed.    ED Treatments /  Results  Labs (all labs ordered are listed, but only abnormal results are displayed) Labs Reviewed  BASIC METABOLIC PANEL - Abnormal; Notable for the following:       Result Value   Glucose, Bld 100 (*)    All other components within normal limits  CBC  I-STAT TROPONIN, ED    EKG  EKG Interpretation None       Radiology Dg Chest 2 View  Result Date: 04/16/2017 CLINICAL DATA:  31 year old male with chest pain, chills, nausea, diarrhea. EXAM: CHEST  2 VIEW  COMPARISON:  Chest radiographs 11/23/2014 and earlier. FINDINGS: Lung volumes are larger compatible with good inspiratory effort. Normal cardiac size and mediastinal contours. Visualized tracheal air column is within normal limits. Lungs are stable and clear. No pneumothorax, pleural effusion or pneumoperitoneum. Negative visible bowel gas pattern. No osseous abnormality identified. IMPRESSION: Negative.  No acute cardiopulmonary abnormality. Electronically Signed   By: Odessa Fleming M.D.   On: 04/16/2017 12:40    Procedures Procedures (including critical care time)  Medications Ordered in ED Medications - No data to display   Initial Impression / Assessment and Plan / ED Course  I have reviewed the triage vital signs and the nursing notes.  Pertinent labs & imaging results that were available during my care of the patient were reviewed by me and considered in my medical decision making (see chart for details).     Vitals:   04/16/17 1131  BP: 124/83  Pulse: (!) 53  Resp: 20  Temp: 98.3 F (36.8 C)  TempSrc: Oral  SpO2: 96%    Medications - No data to display  [name] is 31 y.o. male presenting with symptoms consistent with viral illness. Patient is nontoxic appearing. Vital signs reassuring with excellent oxygen saturation, lung sounds are clear to auscultation, doubt superimposed pneumonia. She has no other comorbidities.  Work note is provided, I counseled patient on infection control precautions. We've had an extensive discussion of return precautions and patient verbalizes understanding and teach back technique.Triage initiated the EKG unremarkable, chest x-ray clear.   Evaluation does not show pathology that would require ongoing emergent intervention or inpatient treatment. Pt is hemodynamically stable and mentating appropriately. Discussed findings and plan with patient/guardian, who agrees with care plan. All questions answered. Return precautions discussed and  outpatient follow up given.      Final Clinical Impressions(s) / ED Diagnoses   Final diagnoses:  Acute viral syndrome    New Prescriptions New Prescriptions   No medications on file     Kaylyn Lim 04/16/17 1257    Linwood Dibbles, MD 04/18/17 0020

## 2017-04-16 NOTE — Discharge Instructions (Signed)
Do not hesitate to return to the emergency room for any new, worsening or concerning symptoms. ° °Please obtain primary care using resource guide below. Let them know that you were seen in the emergency room and that they will need to obtain records for further outpatient management. ° ° °

## 2017-04-16 NOTE — ED Triage Notes (Signed)
Sore throat  And cp off and on since yesterday  Left ear hurts

## 2017-04-16 NOTE — ED Notes (Signed)
Hooked patient up to the monitor into a gown waiting on provider

## 2017-07-03 ENCOUNTER — Encounter (HOSPITAL_COMMUNITY): Payer: Self-pay | Admitting: *Deleted

## 2017-07-03 ENCOUNTER — Emergency Department (HOSPITAL_COMMUNITY)
Admission: EM | Admit: 2017-07-03 | Discharge: 2017-07-03 | Disposition: A | Discharge status: Home / Self Care | Payer: Self-pay | Attending: Emergency Medicine | Admitting: Emergency Medicine

## 2017-07-03 DIAGNOSIS — F17200 Nicotine dependence, unspecified, uncomplicated: Secondary | ICD-10-CM | POA: Insufficient documentation

## 2017-07-03 DIAGNOSIS — M545 Low back pain, unspecified: Secondary | ICD-10-CM

## 2017-07-03 DIAGNOSIS — Y990 Civilian activity done for income or pay: Secondary | ICD-10-CM | POA: Insufficient documentation

## 2017-07-03 DIAGNOSIS — S39012A Strain of muscle, fascia and tendon of lower back, initial encounter: Secondary | ICD-10-CM | POA: Insufficient documentation

## 2017-07-03 DIAGNOSIS — Y9289 Other specified places as the place of occurrence of the external cause: Secondary | ICD-10-CM | POA: Insufficient documentation

## 2017-07-03 DIAGNOSIS — Y9389 Activity, other specified: Secondary | ICD-10-CM | POA: Insufficient documentation

## 2017-07-03 DIAGNOSIS — Y33XXXA Other specified events, undetermined intent, initial encounter: Secondary | ICD-10-CM | POA: Insufficient documentation

## 2017-07-03 DIAGNOSIS — M62830 Muscle spasm of back: Secondary | ICD-10-CM | POA: Insufficient documentation

## 2017-07-03 MED ORDER — CYCLOBENZAPRINE HCL 10 MG PO TABS: 10.0000 mg | ORAL_TABLET | Freq: Three times a day (TID) | ORAL | 0 refills | Status: DC | PRN

## 2017-07-03 MED ORDER — CYCLOBENZAPRINE HCL 10 MG PO TABS
10.0000 mg | ORAL_TABLET | Freq: Once | ORAL | Status: AC
  Administered 2017-07-03: 10 mg via ORAL
  Filled 2017-07-03: qty 1

## 2017-07-03 MED ORDER — NAPROXEN 500 MG PO TABS: 500.0000 mg | ORAL_TABLET | Freq: Two times a day (BID) | ORAL | 0 refills | Status: DC

## 2017-07-03 MED ORDER — KETOROLAC TROMETHAMINE 30 MG/ML IJ SOLN
60.0000 mg | Freq: Once | INTRAMUSCULAR | Status: AC
Start: 2017-07-03 — End: 2017-07-03
  Administered 2017-07-03: 60 mg via INTRAMUSCULAR
  Filled 2017-07-03: qty 2

## 2017-07-03 NOTE — Discharge Instructions (Signed)
Back Pain: Your back pain should be treated with medicines such as ibuprofen or aleve and this back pain should get better over the next 2 weeks.  However if you develop severe or worsening pain, low back pain with fever, numbness, weakness or inability to walk or urinate, you should return to the ER immediately.  Please follow up with your doctor this week for a recheck if still having symptoms.  Avoid heavy lifting over 10 pounds over the next two weeks.  Low back pain is discomfort in the lower back that may be due to injuries to muscles and ligaments around the spine.  Occasionally, it may be caused by a a problem to a part of the spine called a disc.  The pain may last several days or a week;  However, most patients get completely well in 4 weeks.  Self - care:  The application of heat can help soothe the pain.  Maintaining your daily activities, including walking, is encourged, as it will help you get better faster than just staying in bed. Perform gentle stretching as discussed. Drink plenty of fluids.  Medications are also useful to help with pain control.  A commonly prescribed medication includes over the counter tylenol.  Non steroidal anti inflammatory medications including Ibuprofen and naproxen;  These medications help both pain and swelling and are very useful in treating back pain.  They should be taken with food, as they can cause stomach upset, and more seriously, stomach bleeding.    Muscle relaxants:  These medications can help with muscle tightness that is a cause of lower back pain.  Most of these medications can cause drowsiness, and it is not safe to drive or use dangerous machinery while taking them.  SEEK IMMEDIATE MEDICAL ATTENTION IF: New numbness, tingling, weakness, or problem with the use of your arms or legs.  Severe back pain not relieved with medications.  Difficulty with or loss of control of your bowel or bladder control.  Increasing pain in any areas of the body  (such as chest or abdominal pain).  Shortness of breath, dizziness or fainting.  Nausea (feeling sick to your stomach), vomiting, fever, or sweats.  You will need to follow up with the  and wellness center in 1-2 weeks for reassessment and to establish medical care.

## 2017-07-03 NOTE — ED Triage Notes (Signed)
To ED for eval of lower back pain since last week. Pt has strenuous job - but no known injury. Pain doesn't move down legs. States he had a day of pain/skin tingling last week in right groin but that has resolved. Feels better for pt to stand or lay on stomach. No change in urine or bm habits. No extremity numbness or tingling.

## 2017-07-03 NOTE — ED Provider Notes (Signed)
[facility]   CSN: [id number] Arrival date & time: 07/03/17  [id number]     History   Chief Complaint Chief Complaint  Patient presents with  . Back Pain    HPI [name] is a 31 y.o. male who presents to the ED with complaints of right lower back pain that began yesterday after he lifted a heavy toilet on Monday (2 days ago). 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 He describes the pain is 10/10 constant throbbing nonradiating right lower back pain that worsens with standing, sitting, laying flat on his back, and bending, and has been mildly improved with massage and heat use. He denies history of trauma or falls. He denies history of cancer or IV drug use. He does not currently have a PCP. He denies fevers, chills, CP, SOB, abd pain, N/V/D/C, hematuria, dysuria, testicular pain/swelling, penile discharge, incontinence of urine/stool, saddle anesthesia/cauda equina symptoms, myalgias, arthralgias, numbness, tingling, focal weakness, or any other complaints at this time.    The history is provided by the patient and medical records. No language interpreter was used.  Back Pain   This is a new problem. The current episode started yesterday. The problem occurs constantly. The problem has not changed since onset.The pain is associated with lifting heavy objects. The pain is present in the lumbar spine. Quality: throbbing. The pain does not radiate. The pain is at a severity of 10/10. The pain is moderate. The symptoms are aggravated by bending, twisting and certain positions. The pain is the same all the time. Pertinent negatives include no chest pain, no fever, no numbness, no abdominal pain, no bowel incontinence, no perianal numbness, no bladder incontinence, no dysuria, no paresthesias, no  paresis, no tingling and no weakness. He has tried heat for the symptoms. The treatment provided mild relief.    History reviewed. No pertinent past medical history.  There are no active problems to display for this patient.   History reviewed. No pertinent surgical history.     Home Medications    Prior to Admission medications   Medication Sig Start Date End Date Taking? Authorizing Provider  cephALEXin (KEFLEX) 500 MG capsule Take 1 capsule (500 mg total) by mouth 4 (four) times daily. Patient not taking: Reported on 11/24/2014 03/19/14   Trixie DredgeWest, Emily, PA-C  doxycycline (VIBRAMYCIN) 100 MG capsule Take 1 capsule (100 mg total) by mouth 2 (two) times daily. One po bid x 7 days Patient not taking: Reported on 11/24/2014 03/19/14   Trixie DredgeWest, Emily, PA-C  ibuprofen (ADVIL,MOTRIN) 800 MG tablet Take 1 tablet (800 mg total) by mouth every 8 (eight) hours as needed for fever or moderate pain. 11/24/14   Tomasita Crumbleni, Adeleke, MD  oxyCODONE-acetaminophen (PERCOCET/ROXICET) 5-325 MG per tablet Take 1-2 tablets by mouth every 4 (four) hours as needed for moderate pain or severe pain. Patient not taking: Reported on 11/24/2014 03/19/14   Trixie DredgeWest, Emily, PA-C    Family History No family history on file.  Social History Social History  Substance Use Topics  . Smoking status: Current Every Day Smoker  . Smokeless tobacco: Never Used  . Alcohol use Yes     Allergies   Patient has no known allergies.   Review of Systems Review of Systems  Constitutional: Negative for chills and fever.  Respiratory: Negative  for shortness of breath.   Cardiovascular: Negative for chest pain.  Gastrointestinal: Negative for abdominal pain, bowel incontinence, constipation, diarrhea, nausea and vomiting.  Genitourinary: Negative for bladder incontinence, difficulty urinating (no incontinence), discharge, dysuria, hematuria, scrotal swelling and testicular pain.  Musculoskeletal: Positive for back pain. Negative for  arthralgias and myalgias.  Skin: Negative for color change.  Allergic/Immunologic: Negative for immunocompromised state.  Neurological: Negative for tingling, weakness, numbness and paresthesias.  Psychiatric/Behavioral: Negative for confusion.   All other systems reviewed and are negative for acute change except as noted in the HPI.    Physical Exam Updated Vital Signs BP 119/74 (BP Location: Right Arm)   Pulse 97   Temp 97.9 F (36.6 C) (Oral)   Resp 16   SpO2 100%   Physical Exam  Constitutional: He is oriented to person, place, and time. Vital signs are normal. He appears well-developed and well-nourished.  Non-toxic appearance. No distress.  Afebrile, nontoxic, NAD  HENT:  Head: Normocephalic and atraumatic.  Mouth/Throat: Oropharynx is clear and moist and mucous membranes are normal.  Eyes: Conjunctivae and EOM are normal. Right eye exhibits no discharge. Left eye exhibits no discharge.  Neck: Normal range of motion. Neck supple.  Cardiovascular: Normal rate, regular rhythm, normal heart sounds and intact distal pulses.  Exam reveals no gallop and no friction rub.   No murmur heard. Pulmonary/Chest: Effort normal and breath sounds normal. No respiratory distress. He has no decreased breath sounds. He has no wheezes. He has no rhonchi. He has no rales.  Abdominal: Soft. Normal appearance and bowel sounds are normal. He exhibits no distension. There is no tenderness. There is no rigidity, no rebound, no guarding, no CVA tenderness, no tenderness at McBurney's point and negative Murphy's sign.  Musculoskeletal:       Lumbar back: He exhibits tenderness and spasm. He exhibits normal range of motion, no bony tenderness and no deformity.       Back:  Lumbar spine with FROM intact without spinous process TTP, no bony stepoffs or deformities, with mild R sided paraspinous muscle TTP and palpable muscle spasms. Strength and sensation grossly intact in all extremities, negative SLR  bilaterally, gait steady and nonantalgic. No overlying skin changes. Distal pulses intact.   Neurological: He is alert and oriented to person, place, and time. He has normal strength. No sensory deficit.  Skin: Skin is warm, dry and intact. No rash noted.  Psychiatric: He has a normal mood and affect.  Nursing note and vitals reviewed.    ED Treatments / Results  Labs (all labs ordered are listed, but only abnormal results are displayed) Labs Reviewed - No data to display  EKG  EKG Interpretation None       Radiology No results found.  Procedures Procedures (including critical care time)  Medications Ordered in ED Medications  ketorolac (TORADOL) 30 MG/ML injection 60 mg (60 mg Intramuscular Given 07/03/17 1148)  cyclobenzaprine (FLEXERIL) tablet 10 mg (10 mg Oral Given 07/03/17 1148)     Initial Impression / Assessment and Plan / ED Course  I have reviewed the triage vital signs and the nursing notes.  Pertinent labs & imaging results that were available during my care of the patient were reviewed by me and considered in my medical decision making (see chart for details).     31 y.o. male here with with c/o back pain after lifting a heavy toilet at work. On exam, mild R sided paraspinous muscle TTP in lumbar region; no midline  spinal tenderness. No red flag s/s of low back pain. No s/s of central cord compression or cauda equina. Lower extremities are neurovascularly intact and patient is ambulating without difficulty. No urinary complaints. Doubt need for imaging/labs, likely muscular strain.  Patient was counseled on back pain precautions and told to do activity as tolerated but do not lift, push, or pull heavy objects more than 10 pounds for the next week. Patient counseled to use ice or heat on back for no longer than 15 minutes every hour.   Rx given for muscle relaxer and counseled on proper use of muscle relaxant medication. Urged patient not to drink alcohol,  drive, or perform any other activities that requires focus while taking these medications. Rx for naprosyn given. Advised tylenol use as well.   Patient urged to follow-up with [facility] in 1-2wks for recheck and to establish care, or sooner if pain does not improve with treatment and rest or if pain becomes recurrent. Urged to return with worsening severe pain, loss of bowel or bladder control, trouble walking. The patient verbalizes understanding and agrees with the plan.    Final Clinical Impressions(s) / ED Diagnoses   Final diagnoses:  Acute right-sided low back pain without sciatica  Muscle spasm of back  Strain of lumbar region, initial encounter    New Prescriptions New Prescriptions   CYCLOBENZAPRINE (FLEXERIL) 10 MG TABLET    Take 1 tablet (10 mg total) by mouth 3 (three) times daily as needed for muscle spasms.   NAPROXEN (NAPROSYN) 500 MG TABLET    Take 1 tablet (500 mg total) by mouth 2 (two) times daily with a meal.     [address] [place], [place] 07/03/17 1229    Cathren Laine, MD 07/03/17 [phone]

## 2017-07-08 ENCOUNTER — Ambulatory Visit (HOSPITAL_COMMUNITY)
Admission: EM | Admit: 2017-07-08 | Discharge: 2017-07-08 | Disposition: A | Discharge status: Home / Self Care | Payer: Self-pay | Attending: Emergency Medicine | Admitting: Emergency Medicine

## 2017-07-08 ENCOUNTER — Encounter (HOSPITAL_COMMUNITY): Payer: Self-pay | Admitting: Emergency Medicine

## 2017-07-08 DIAGNOSIS — M545 Low back pain, unspecified: Secondary | ICD-10-CM

## 2017-07-08 NOTE — ED Triage Notes (Signed)
Pt states he went to the ER for back pain, they told him he had a back strain. Pt states he was given a note stating he couldn't carry ten pounds so his job states he needs to go back and get a note saying he can work. Pt states he is getting better but hes not 100% better.

## 2017-07-08 NOTE — ED Provider Notes (Signed)
[facility]    CSN: [id number] Arrival date & time: 07/08/17  1328     History   Chief Complaint Chief Complaint  Patient presents with  . Back Pain  . Letter for School/Work    HPI [name] is a 31 y.o. male.   Taelon presents with right sided low back pain which started approximately 10/24. 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 He went to the ED and was diagnosed with a strain, provided naproxen and muscle relaxer and told to limit weight lifting to 10lbs. He states his back pain has improved but persists, and he is to go back to work today. He was unable to get an appointment with a PCP. Denies radiation of pain or previous back injury. Pain is worse with flexion and extension, as well as with sitting for long periods of time. Rates it a 4/10. Without weakness to his legs or numbness or tingling. Ambulatory. Denies loss of bowel or bladder function.   ROS per HPI.       History reviewed. No pertinent past medical history.  There are no active problems to display for this patient.   History reviewed. No pertinent surgical history.     Home Medications    Prior to Admission medications   Medication Sig Start Date End Date Taking? Authorizing Provider  cyclobenzaprine (FLEXERIL) 10 MG tablet Take 1 tablet (10 mg total) by mouth 3 (three) times daily as needed for muscle spasms. 07/03/17  Yes Street, [place], PA-C  naproxen (NAPROSYN) 500 MG tablet Take 1 tablet (500 mg total) by mouth 2 (two) times daily with a meal. 07/03/17  Yes Street, [place], PA-C  cephALEXin (KEFLEX) 500 MG capsule Take 1 capsule (500 mg total) by mouth 4 (four) times daily. Patient not taking: Reported on 11/24/2014 03/19/14   Trixie Dredge, PA-C  doxycycline (VIBRAMYCIN) 100 MG capsule Take 1 capsule (100 mg total) by mouth 2 (two) times daily. One po bid x 7 days Patient not taking: Reported on 11/24/2014 03/19/14   Trixie Dredge, PA-C  ibuprofen (ADVIL,MOTRIN) 800 MG tablet Take 1 tablet (800 mg total) by mouth every 8 (eight) hours as needed for fever or moderate pain. Patient not taking: Reported on 07/03/2017 11/24/14   Tomasita Crumble, MD  oxyCODONE-acetaminophen (PERCOCET/ROXICET) 5-325 MG per tablet Take 1-2 tablets by mouth every 4 (four) hours as needed for moderate pain or severe pain. Patient not taking: Reported on 11/24/2014 03/19/14   Trixie Dredge, PA-C    Family History No family history on file.  Social History Social History  Substance Use Topics  . Smoking status: Current Every Day Smoker  . Smokeless tobacco: Never Used  . Alcohol use Yes     Allergies   Patient has no known allergies.   Review of Systems Review of Systems   Physical Exam Triage Vital Signs ED Triage Vitals  Enc Vitals Group     BP 07/08/17 1355 108/66     Pulse Rate 07/08/17 1355 86     Resp 07/08/17 1355 16     Temp 07/08/17 1355 98.5 F (36.9 C)     Temp src --      SpO2 07/08/17 1355 100 %     Weight --      Height --      Head Circumference --      Peak Flow --      Pain Score 07/08/17 1357 4  Pain Loc --      Pain Edu? --      Excl. in GC? --    No data found.   Updated Vital Signs BP 108/66   Pulse 86   Temp 98.5 F (36.9 C)   Resp 16   SpO2 100%   Visual Acuity Right Eye Distance:   Left Eye Distance:   Bilateral Distance:    Right Eye Near:   Left Eye Near:    Bilateral Near:     Physical Exam  Constitutional: He is oriented to person, place, and time. He appears well-developed and well-nourished.  Cardiovascular: Normal rate, regular rhythm, normal heart sounds, intact distal pulses and normal pulses.   Pulmonary/Chest: Effort normal and breath sounds normal.  Musculoskeletal:       Lumbar back: He exhibits pain. He exhibits normal range of motion, no tenderness and no bony tenderness.  right low back pain with forward bend and with right hip flexion and right straight leg  raise  Neurological: He is alert and oriented to person, place, and time. He displays normal reflexes. No sensory deficit. Coordination normal.  Skin: Skin is warm and dry.     UC Treatments / Results  Labs (all labs ordered are listed, but only abnormal results are displayed) Labs Reviewed - No data to display  EKG  EKG Interpretation None       Radiology No results found.  Procedures Procedures (including critical care time)  Medications Ordered in UC Medications - No data to display   Initial Impression / Assessment and Plan / UC Course  I have reviewed the triage vital signs and the nursing notes.  Pertinent labs & imaging results that were available during my care of the patient were reviewed by me and considered in my medical decision making (see chart for details).     Continue with activity as tolerated. Twice a day naproxen with food. Muscle relaxer as needed. Heat and ice as needed. Limit lifting for the next three days to 25lbs. Recommended establishment with pcp for continued management. If symptoms worsen or do not improve in the next week to return to be seen or to follow up with PCP. Patient verbalized understanding and agreeable to plan. Ambulatory out of clinic without difficulty.   Georgetta HaberNatalie B Hiilani Jetter, NP 07/08/2017 2:18 PM   Final Clinical Impressions(s) / UC Diagnoses   Final diagnoses:  Acute right-sided low back pain without sciatica    New Prescriptions New Prescriptions   No medications on file     Controlled Substance Prescriptions [place] Controlled Substance Registry consulted? No   Georgetta HaberBurky, Kavaughn Faucett B, NP 07/08/17 1418

## 2017-11-05 ENCOUNTER — Emergency Department (HOSPITAL_COMMUNITY): Payer: Self-pay

## 2017-11-05 ENCOUNTER — Other Ambulatory Visit: Payer: Self-pay

## 2017-11-05 ENCOUNTER — Emergency Department (HOSPITAL_COMMUNITY)
Admission: EM | Admit: 2017-11-05 | Discharge: 2017-11-05 | Disposition: A | Discharge status: Home / Self Care | Payer: Self-pay | Attending: Emergency Medicine | Admitting: Emergency Medicine

## 2017-11-05 DIAGNOSIS — F17210 Nicotine dependence, cigarettes, uncomplicated: Secondary | ICD-10-CM | POA: Insufficient documentation

## 2017-11-05 DIAGNOSIS — J069 Acute upper respiratory infection, unspecified: Secondary | ICD-10-CM | POA: Insufficient documentation

## 2017-11-05 DIAGNOSIS — R05 Cough: Secondary | ICD-10-CM | POA: Insufficient documentation

## 2017-11-05 DIAGNOSIS — B9789 Other viral agents as the cause of diseases classified elsewhere: Secondary | ICD-10-CM

## 2017-11-05 MED ORDER — BENZONATATE 100 MG PO CAPS: 100.0000 mg | ORAL_CAPSULE | Freq: Three times a day (TID) | ORAL | 0 refills | Status: DC

## 2017-11-05 NOTE — ED Triage Notes (Signed)
Pt sts that he is having CP and SOB with sore throat. Pt has had this since around 0300 today. Pt has been coughing recently as well. No distress noted.

## 2017-11-05 NOTE — Discharge Instructions (Signed)
Please read attached information. If you experience any new or worsening signs or symptoms please return to the emergency room for evaluation. Please follow-up with your primary care provider or specialist as discussed. Please use medication prescribed only as directed and discontinue taking if you have any concerning signs or symptoms.   °

## 2017-11-05 NOTE — ED Provider Notes (Signed)
[name] EMERGENCY DEPARTMENT Provider Note   CSN: [id number] Arrival date & time: 11/05/17  4098     History   Chief Complaint Chief Complaint  Patient presents with  . Chest Pain    HPI TYCEN DOCKTER is a 32 y.o. male.  HPI   32 year old male presents today with complaints of cough and chest pain.  Patient notes that yesterday he was feeling fatigued with dry nonproductive cough.  He reports waking up this morning with an ache in the right side of his chest.  He reports pain is worse with palpation, not worse with deep inspiration or movement.  He denies any associated nausea or vomiting, diaphoresis or radiation of pain.  Patient denies any indigestion.  He reports he is a smoker, no history of asthma/COPD.  Patient notes that the chest pain has improved today.  Patient denies any cardiac history or significant risk factors other than smoking, he denies any history of DVT or PE or any other concerning signs or symptoms.  No medications prior to arrival.  She denies any close sick contacts.  He did not receive the influenza vaccine this year.   No past medical history on file.  There are no active problems to display for this patient.   No past surgical history on file.     Home Medications    Prior to Admission medications   Medication Sig Start Date End Date Taking? Authorizing Provider  benzonatate (TESSALON) 100 MG capsule Take 1 capsule (100 mg total) by mouth every 8 (eight) hours. 11/05/17   Nyquan Selbe, Tinnie Gens, PA-C  cephALEXin (KEFLEX) 500 MG capsule Take 1 capsule (500 mg total) by mouth 4 (four) times daily. Patient not taking: Reported on 11/24/2014 03/19/14   Trixie Dredge, PA-C  cyclobenzaprine (FLEXERIL) 10 MG tablet Take 1 tablet (10 mg total) by mouth 3 (three) times daily as needed for muscle spasms. 07/03/17   Street, [place], PA-C  doxycycline (VIBRAMYCIN) 100 MG capsule Take 1 capsule (100 mg total) by mouth 2 (two) times daily. One po  bid x 7 days Patient not taking: Reported on 11/24/2014 03/19/14   Trixie Dredge, PA-C  ibuprofen (ADVIL,MOTRIN) 800 MG tablet Take 1 tablet (800 mg total) by mouth every 8 (eight) hours as needed for fever or moderate pain. Patient not taking: Reported on 07/03/2017 11/24/14   Tomasita Crumble, MD  naproxen (NAPROSYN) 500 MG tablet Take 1 tablet (500 mg total) by mouth 2 (two) times daily with a meal. 07/03/17   Street, [place], [place]  oxyCODONE-acetaminophen (PERCOCET/ROXICET) 5-325 MG per tablet Take 1-2 tablets by mouth every 4 (four) hours as needed for moderate pain or severe pain. Patient not taking: Reported on 11/24/2014 03/19/14   Trixie Dredge, PA-C    Family History No family history on file.  Social History Social History   Tobacco Use  . Smoking status: Current Every Day Smoker  . Smokeless tobacco: Never Used  Substance Use Topics  . Alcohol use: Yes  . Drug use: Yes    Types: Marijuana     Allergies   Patient has no known allergies.   Review of Systems Review of Systems  All other systems reviewed and are negative.    Physical Exam Updated Vital Signs BP 127/74 (BP Location: Right Arm)   Pulse (!) 58   Temp 98.4 F (36.9 C) (Oral)   Resp 17   Ht 5\' 7"  (1.702 m)   Wt 76.2 kg (168 lb)   SpO2 100%  BMI 26.31 kg/m   Physical Exam  Constitutional: He is oriented to person, place, and time. He appears well-developed and well-nourished.  HENT:  Head: Normocephalic and atraumatic.  Eyes: Conjunctivae are normal. Pupils are equal, round, and reactive to light. Right eye exhibits no discharge. Left eye exhibits no discharge. No scleral icterus.  Neck: Normal range of motion. No JVD present. No tracheal deviation present.  Cardiovascular: Normal rate, regular rhythm, normal heart sounds and intact distal pulses. Exam reveals no gallop and no friction rub.  No murmur heard. Pulmonary/Chest: Effort normal and breath sounds normal. No stridor. No respiratory distress. He  has no wheezes. He has no rales. He exhibits no tenderness.  Musculoskeletal: Normal range of motion. He exhibits no edema.  Neurological: He is alert and oriented to person, place, and time. Coordination normal.  Skin: Skin is warm.  Psychiatric: He has a normal mood and affect. His behavior is normal. Judgment and thought content normal.  Nursing note and vitals reviewed.    ED Treatments / Results  Labs (all labs ordered are listed, but only abnormal results are displayed) Labs Reviewed - No data to display  EKG  EKG Interpretation None       Radiology Dg Chest 2 View  Result Date: 11/05/2017 CLINICAL DATA:  Shortness of breath, chest heaviness x 1-2 weeks EXAM: CHEST  2 VIEW COMPARISON:  04/16/2017 FINDINGS: Lungs are clear.  No pleural effusion or pneumothorax. The heart is normal in size. Visualized osseous structures are within normal limits. IMPRESSION: Normal chest radiographs. Electronically Signed   By: Charline BillsSriyesh  Krishnan M.D.   On: 11/05/2017 09:46    Procedures Procedures (including critical care time)  Medications Ordered in ED Medications - No data to display   Initial Impression / Assessment and Plan / ED Course  I have reviewed the triage vital signs and the nursing notes.  Pertinent labs & imaging results that were available during my care of the patient were reviewed by me and considered in my medical decision making (see chart for details).     Final Clinical Impressions(s) / ED Diagnoses   Final diagnoses:  Viral URI with cough    Labs:    Imaging: DG Chest /ED EKG reviewed -normal sinus rhythm without ST depression or elevation  Consults:  Therapeutics:  Discharge Meds: tessalon  Assessment/Plan: 32 year old male presents today with complaints of chest pain.  This is likely secondary to viral upper respiratory infection afebrile no acute distress clear lung sounds negative chest x-ray afebrile.  Patient without any significant findings  consistent with ACS, PE, dissection or any other life-threatening etiology.  Reproducible chest pain on exam.  Patient be discharged with symptomatic care instructions and strict return precautions.  He verbalized understanding and agreement to today's plan.   ED Discharge Orders        Ordered    benzonatate (TESSALON) 100 MG capsule  Every 8 hours     11/05/17 1231       Eyvonne MechanicHedges, Calil Amor, PA-C 11/05/17 1232    Vanetta MuldersZackowski, Scott, MD 11/06/17 [phone]

## 2017-11-05 NOTE — ED Notes (Addendum)
Pt complain of a little throat pian.

## 2017-12-23 ENCOUNTER — Encounter (HOSPITAL_COMMUNITY): Payer: Self-pay | Admitting: Emergency Medicine

## 2017-12-23 ENCOUNTER — Ambulatory Visit (HOSPITAL_COMMUNITY): Payer: Self-pay

## 2017-12-23 ENCOUNTER — Ambulatory Visit (HOSPITAL_COMMUNITY)
Admission: EM | Admit: 2017-12-23 | Discharge: 2017-12-23 | Disposition: A | Discharge status: Home / Self Care | Payer: Self-pay | Attending: Internal Medicine | Admitting: Internal Medicine

## 2017-12-23 DIAGNOSIS — S90112A Contusion of left great toe without damage to nail, initial encounter: Secondary | ICD-10-CM

## 2017-12-23 MED ORDER — DICLOFENAC SODIUM 75 MG PO TBEC: 75.0000 mg | DELAYED_RELEASE_TABLET | Freq: Two times a day (BID) | ORAL | 0 refills | Status: DC | PRN

## 2017-12-23 NOTE — ED Triage Notes (Signed)
Pt sts car part fell on left great toe this am

## 2017-12-23 NOTE — ED Provider Notes (Signed)
[facility]    CSN: [id number] Arrival date & time: 12/23/17  1135     History   Chief Complaint Chief Complaint  Patient presents with  . Foot Pain    HPI [name] is a 32 y.o. male.   32 year old male presents with injury to his left great toe about 3 hours ago. 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 He had immediate pain and slight swelling. No break in the skin. He applied ice with some relief. He has not taken any medication for pain. His boss wanted him "checked out" since he was still having pain and having difficulty walking. No other chronic health issues. Takes no daily medication.   The history is provided by the patient.    History reviewed. No pertinent past medical history.  There are no active problems to display for this patient.   History reviewed. No pertinent surgical history.     Home Medications    Prior to Admission medications   Medication Sig Start Date End Date Taking? Authorizing Provider  diclofenac (VOLTAREN) 75 MG EC tablet Take 1 tablet (75 mg total) by mouth 2 (two) times daily as needed for moderate pain. 12/23/17   Sudie Grumbling, NP    Family History History reviewed. No pertinent family history.  Social History Social History   Tobacco Use  . Smoking status: Current Every Day Smoker  . Smokeless tobacco: Never Used  Substance Use Topics  . Alcohol use: Yes  . Drug use: Yes    Types: Marijuana     Allergies   Patient has no known allergies.   Review of Systems Review of Systems  Constitutional: Negative for activity change, appetite change, chills, fatigue and fever.  Respiratory: Negative for cough, chest tightness, shortness of breath and wheezing.   Gastrointestinal: Negative for diarrhea, nausea and vomiting.  Musculoskeletal: Positive for arthralgias, gait problem and joint  swelling.  Skin: Positive for color change. Negative for rash and wound.  Allergic/Immunologic: Negative for immunocompromised state.  Neurological: Negative for dizziness, tremors, seizures, syncope, weakness, light-headedness, numbness and headaches.  Hematological: Negative for adenopathy. Does not bruise/bleed easily.  Psychiatric/Behavioral: Negative.      Physical Exam Triage Vital Signs ED Triage Vitals [12/23/17 1227]  Enc Vitals Group     BP 118/64     Pulse Rate 68     Resp 18     Temp 98.2 F (36.8 C)     Temp Source Oral     SpO2 100 %     Weight      Height      Head Circumference      Peak Flow      Pain Score      Pain Loc      Pain Edu?      Excl. in GC?    No data found.  Updated Vital Signs BP 118/64 (BP Location: Left Arm)   Pulse 68   Temp 98.2 F (36.8 C) (Oral)   Resp 18   SpO2 100%   Visual Acuity Right Eye Distance:   Left Eye Distance:   Bilateral Distance:    Right Eye Near:   Left Eye Near:    Bilateral Near:     Physical Exam  Constitutional: He is oriented to person, place, and time. He appears well-developed  and well-nourished. No distress.  HENT:  Head: Normocephalic and atraumatic.  Eyes: Conjunctivae and EOM are normal.  Neck: Normal range of motion.  Cardiovascular: Normal rate.  Pulmonary/Chest: Effort normal.  Musculoskeletal: Normal range of motion. He exhibits tenderness.       Left foot: There is tenderness and swelling. There is normal range of motion, normal capillary refill, no crepitus, no deformity and no laceration.       Feet:  Full range of motion of left foot and toes. Able to bend left great toe but with pain. Slight redness and swelling at base of distal joint. No abrasion or bruising present. No detectable injury to the nail. Good capillary refill and normal distal pulses. No neuro deficits noted.   Neurological: He is alert and oriented to person, place, and time. He has normal strength. No sensory  deficit. Gait abnormal.  Pain with walking but able to bear weight.   Skin: Skin is warm, dry and intact. Capillary refill takes less than 2 seconds. No abrasion, no bruising, no ecchymosis and no laceration noted. There is erythema.  Psychiatric: He has a normal mood and affect. His speech is normal and behavior is normal. Judgment and thought content normal.  Vitals reviewed.    UC Treatments / Results  Labs (all labs ordered are listed, but only abnormal results are displayed) Labs Reviewed - No data to display  EKG None Radiology No results found.  Procedures Procedures (including critical care time)  Medications Ordered in UC Medications - No data to display   Initial Impression / Assessment and Plan / UC Course  I have reviewed the triage vital signs and the nursing notes.  Pertinent labs & imaging results that were available during my care of the patient were reviewed by me and considered in my medical decision making (see chart for details).    Discussed performing x-ray- patient declines at this time since he has full range of motion of foot and toe and no bruising. Wrapped toe and foot in ace wrap. Continue to apply ice and keep foot elevated. Wear shoes with hard soles and support (no flip flops or slides). May take Diclofenac 75mg  every 12 hours as needed for pain and swelling. Note written for work. Follow-up here in 2 to 3 days if not improving.   Final Clinical Impressions(s) / UC Diagnoses   Final diagnoses:  Contusion of left great toe without damage to nail, initial encounter    ED Discharge Orders        Ordered    diclofenac (VOLTAREN) 75 MG EC tablet  2 times daily PRN     12/23/17 1255       Controlled Substance Prescriptions [place] Controlled Substance Registry consulted? Not Applicable   Sudie Grumblingmyot, Arda Daggs Berry, NP 12/23/17 2052

## 2017-12-23 NOTE — Discharge Instructions (Signed)
Recommend continue to apply ice to area and keep foot elevated today. Wear ace wrap for support. May take Diclofenac 75mg  every 12 hours as needed for pain. Follow-up here in 2 to 3 days if not improving.

## 2018-02-04 ENCOUNTER — Encounter (HOSPITAL_COMMUNITY): Payer: Self-pay | Admitting: Emergency Medicine

## 2018-02-04 ENCOUNTER — Ambulatory Visit (HOSPITAL_COMMUNITY)
Admission: EM | Admit: 2018-02-04 | Discharge: 2018-02-04 | Disposition: A | Discharge status: Home / Self Care | Payer: Self-pay | Attending: Internal Medicine | Admitting: Internal Medicine

## 2018-02-04 DIAGNOSIS — T07XXXA Unspecified multiple injuries, initial encounter: Secondary | ICD-10-CM

## 2018-02-04 DIAGNOSIS — R519 Headache, unspecified: Secondary | ICD-10-CM

## 2018-02-04 DIAGNOSIS — S0590XA Unspecified injury of unspecified eye and orbit, initial encounter: Secondary | ICD-10-CM

## 2018-02-04 DIAGNOSIS — R51 Headache: Secondary | ICD-10-CM

## 2018-02-04 MED ORDER — SULFACETAMIDE SODIUM 10 % OP SOLN: 1.0000 [drp] | OPHTHALMIC | 0 refills | Status: AC

## 2018-02-04 MED ORDER — TETRACAINE HCL 0.5 % OP SOLN
OPHTHALMIC | Status: AC
  Filled 2018-02-04: qty 4
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
## 2018-02-04 NOTE — ED Notes (Signed)
Visual acuity:  R: 20/100  L : 20/50  Both :  20/50

## 2018-03-30 ENCOUNTER — Emergency Department (HOSPITAL_COMMUNITY): Payer: Self-pay

## 2018-03-30 ENCOUNTER — Encounter (HOSPITAL_COMMUNITY): Payer: Self-pay | Admitting: Emergency Medicine

## 2018-03-30 ENCOUNTER — Emergency Department (HOSPITAL_COMMUNITY)
Admission: EM | Admit: 2018-03-30 | Discharge: 2018-03-30 | Disposition: A | Discharge status: Home / Self Care | Payer: Self-pay | Attending: Emergency Medicine | Admitting: Emergency Medicine

## 2018-03-30 DIAGNOSIS — Y9389 Activity, other specified: Secondary | ICD-10-CM | POA: Insufficient documentation

## 2018-03-30 DIAGNOSIS — F1210 Cannabis abuse, uncomplicated: Secondary | ICD-10-CM | POA: Insufficient documentation

## 2018-03-30 DIAGNOSIS — F17200 Nicotine dependence, unspecified, uncomplicated: Secondary | ICD-10-CM | POA: Insufficient documentation

## 2018-03-30 DIAGNOSIS — S62114A Nondisplaced fracture of triquetrum [cuneiform] bone, right wrist, initial encounter for closed fracture: Secondary | ICD-10-CM

## 2018-03-30 DIAGNOSIS — Y929 Unspecified place or not applicable: Secondary | ICD-10-CM | POA: Insufficient documentation

## 2018-03-30 DIAGNOSIS — Y998 Other external cause status: Secondary | ICD-10-CM | POA: Insufficient documentation

## 2018-03-30 MED ORDER — IBUPROFEN 400 MG PO TABS
400.0000 mg | ORAL_TABLET | Freq: Once | ORAL | Status: AC | PRN
  Administered 2018-03-30: 400 mg via ORAL
  Filled 2018-03-30: qty 1

## 2018-03-30 MED ORDER — HYDROCODONE-ACETAMINOPHEN 5-325 MG PO TABS
1.0000 | ORAL_TABLET | Freq: Once | ORAL | Status: AC
  Administered 2018-03-30: 1 via ORAL
  Filled 2018-03-30: qty 1

## 2018-03-30 MED ORDER — HYDROCODONE-ACETAMINOPHEN 5-325 MG PO TABS: 2.0000 | ORAL_TABLET | Freq: Four times a day (QID) | ORAL | 0 refills | Status: AC

## 2018-03-30 MED ORDER — OXYCODONE-ACETAMINOPHEN 5-325 MG PO TABS
1.0000 | ORAL_TABLET | ORAL | Status: DC | PRN
  Administered 2018-03-30: 1 via ORAL
  Filled 2018-03-30: qty 1
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
## 2018-03-30 NOTE — ED Notes (Signed)
Patient given discharge instructions and verbalized understanding.  Patient stable to discharge at this time.  Patient is alert and oriented to baseline.  No distressed noted at this time.  All belongings taken with the patient at discharge.   
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
## 2018-03-31 MED FILL — HYDROCODON-APAP 5-325: 5-325 | 3 days supply | Qty: 24 | Fill #0

## 2018-06-19 ENCOUNTER — Encounter (HOSPITAL_COMMUNITY): Payer: Self-pay | Admitting: Emergency Medicine

## 2018-06-19 ENCOUNTER — Ambulatory Visit (HOSPITAL_COMMUNITY)
Admission: EM | Admit: 2018-06-19 | Discharge: 2018-06-19 | Disposition: A | Discharge status: Home / Self Care | Payer: Self-pay | Attending: Family Medicine | Admitting: Family Medicine

## 2018-06-19 DIAGNOSIS — M25531 Pain in right wrist: Secondary | ICD-10-CM

## 2018-06-19 MED ORDER — DICLOFENAC SODIUM 75 MG PO TBEC: 75.0000 mg | DELAYED_RELEASE_TABLET | Freq: Two times a day (BID) | ORAL | 0 refills | Status: DC
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
## 2018-06-19 NOTE — ED Triage Notes (Signed)
PT broke his right hand a few months ago and did not follow up with ortho. PT is now having difficulties at work due to hand pain

## 2018-06-29 IMAGING — DX DG CHEST 2V
2 series · 2 of 2 positions shown · non-contrast
Comparison: Chest radiographs 11/23/2014 and earlier.

CLINICAL DATA: 31-year-old male with chest pain, chills, nausea,
diarrhea.

EXAM:
CHEST  2 VIEW

[w chest pa]
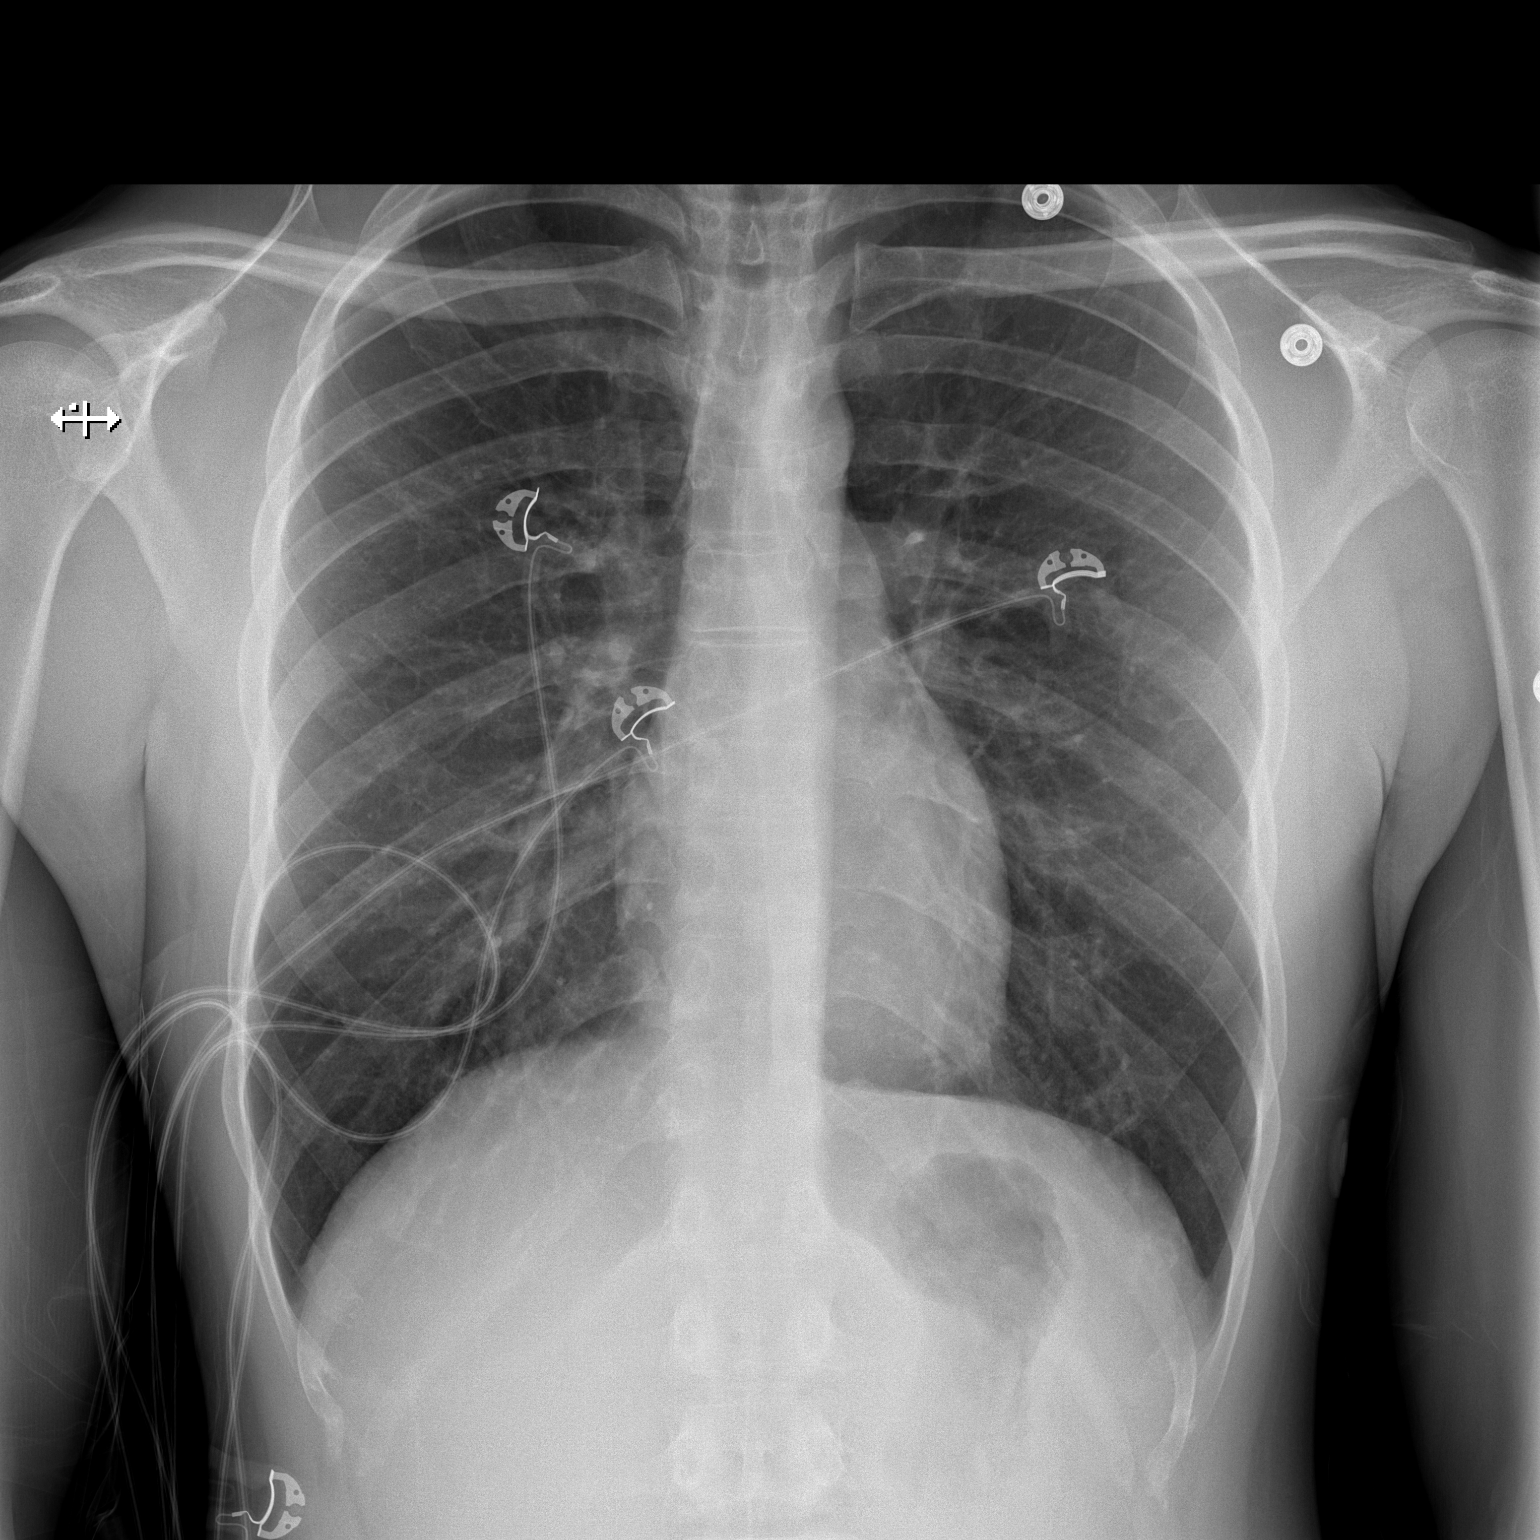

[w chest lat]
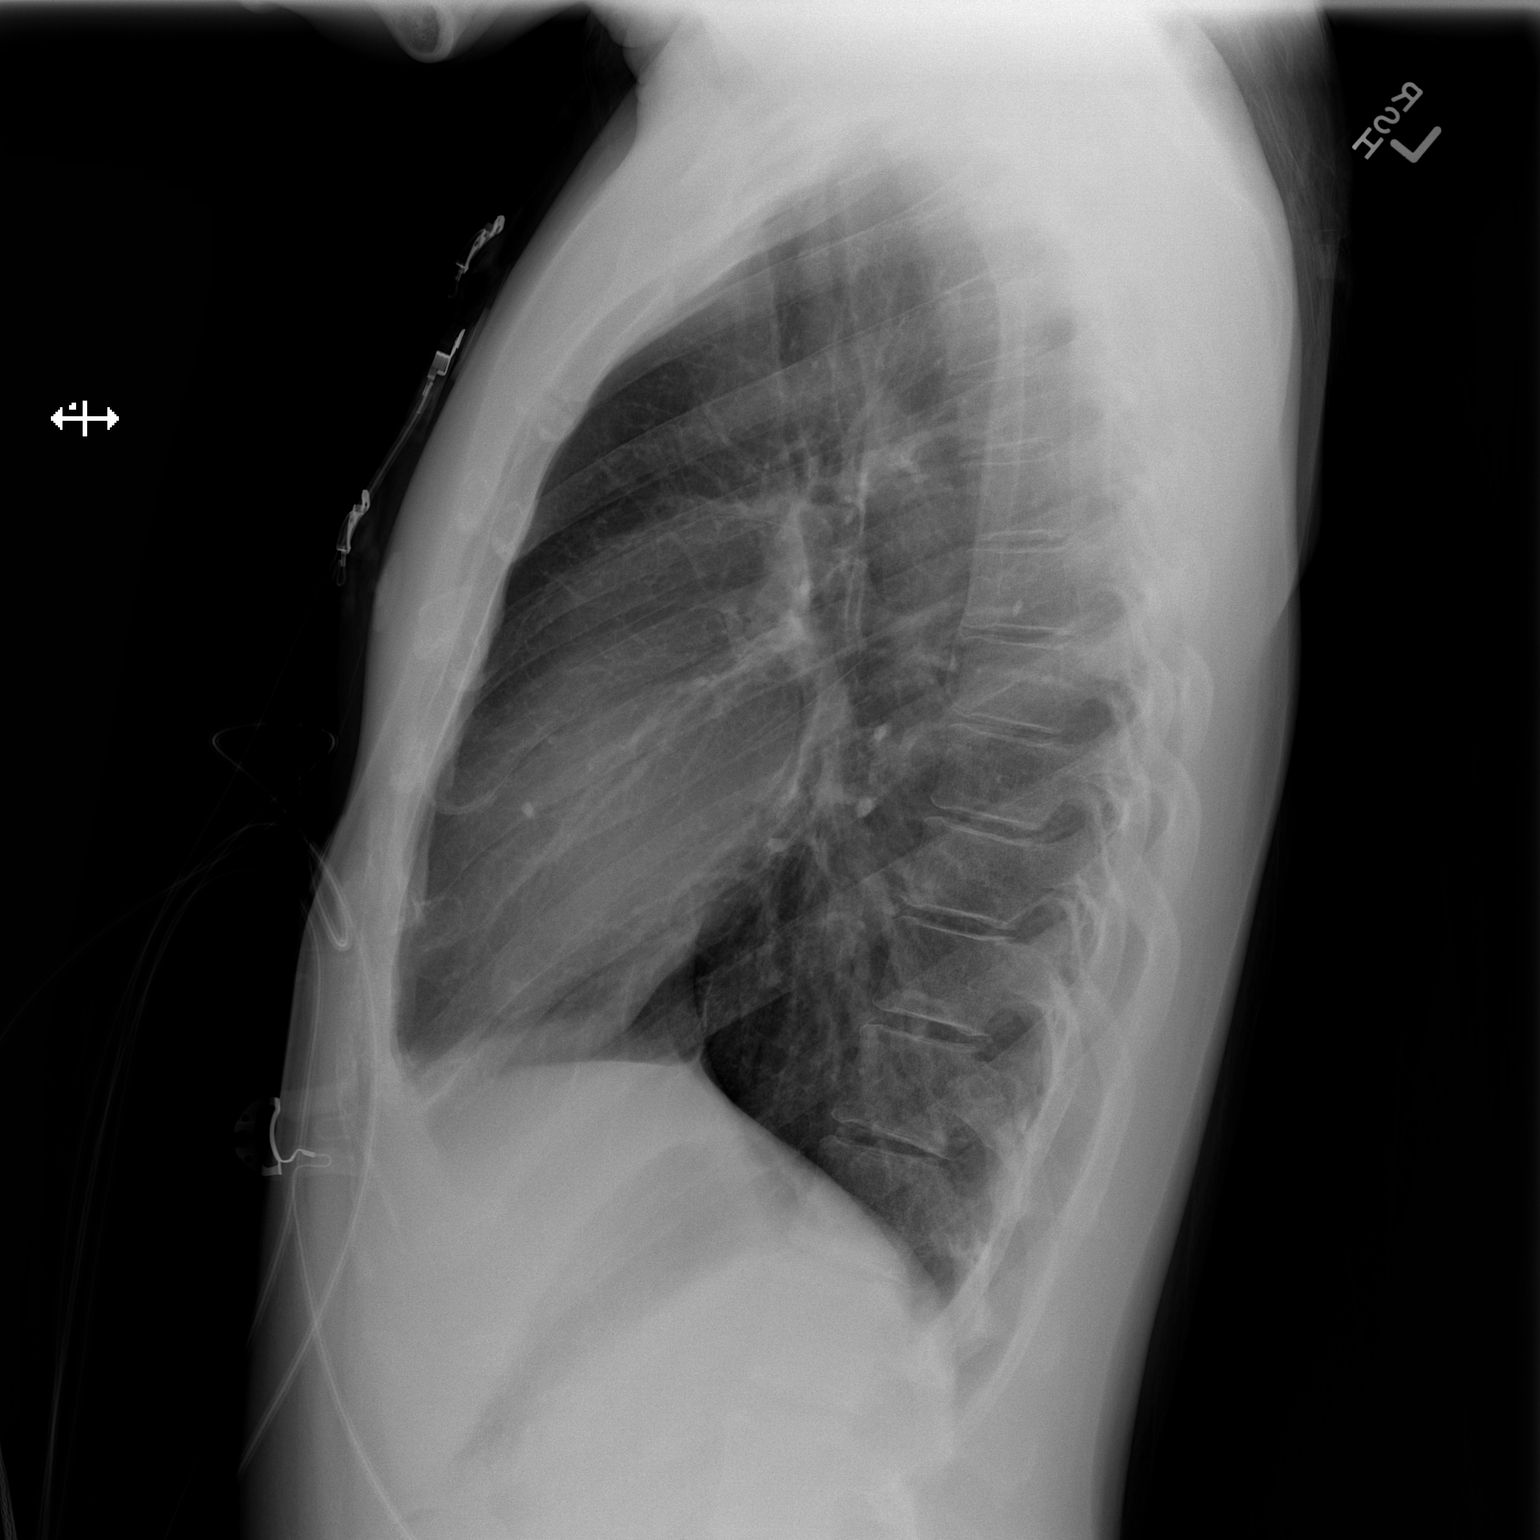

[2 of 2 positions shown; findings below may reference images not displayed]

FINDINGS: Lung volumes are larger compatible with good inspiratory effort.
Normal cardiac size and mediastinal contours. Visualized tracheal
air column is within normal limits. Lungs are stable and clear. No
pneumothorax, pleural effusion or pneumoperitoneum. Negative visible
bowel gas pattern. No osseous abnormality identified.
IMPRESSION: Negative.  No acute cardiopulmonary abnormality.

## 2018-07-13 ENCOUNTER — Other Ambulatory Visit: Payer: Self-pay

## 2018-07-13 ENCOUNTER — Emergency Department (HOSPITAL_COMMUNITY): Payer: Self-pay

## 2018-07-13 ENCOUNTER — Encounter (HOSPITAL_COMMUNITY): Payer: Self-pay | Admitting: Emergency Medicine

## 2018-07-13 ENCOUNTER — Emergency Department (HOSPITAL_COMMUNITY)
Admission: EM | Admit: 2018-07-13 | Discharge: 2018-07-13 | Disposition: A | Discharge status: Home / Self Care | Payer: Self-pay | Attending: Emergency Medicine | Admitting: Emergency Medicine

## 2018-07-13 DIAGNOSIS — J069 Acute upper respiratory infection, unspecified: Secondary | ICD-10-CM

## 2018-07-13 DIAGNOSIS — F17200 Nicotine dependence, unspecified, uncomplicated: Secondary | ICD-10-CM | POA: Insufficient documentation

## 2018-07-13 DIAGNOSIS — B9789 Other viral agents as the cause of diseases classified elsewhere: Secondary | ICD-10-CM

## 2018-07-13 NOTE — ED Triage Notes (Addendum)
Pt states his dad had the flu last week. Pt states he now has the same symptoms for 2-3days. He states he has a cough with congestion and some diarrhea, also has pain when coughing. No abdominal pain, no chest pain at present. No shortness of breath. Pt also reports fever and chills. Pt has only had diarrhea X 2. Pt c.o,. Of a headache 5/10 at present.

## 2018-07-13 NOTE — ED Provider Notes (Signed)
[facility] EMERGENCY DEPARTMENT Provider Note   CSN: [id number] Arrival date & time: 07/13/18  1551     History   Chief Complaint Chief Complaint  Patient presents with  . Cough  . Diarrhea  . Nasal Congestion    HPI [name] is a 32 y.o. male with h/o marijuana use (blunts) here for concern of flu.  Reports his father was sick last week.  He reports cough with yellow mucus, forceful.  Associated with generalized malaise, chills, subjective fevers, nasal congestion, runny nose, scratchy itchy throat, increased gas, looser stools onset Friday. 2 episodes of non bloody non melenotic diarrhea.  Took nyquil without relief.  He denies headache, CP, SOB, abdominal pain, dysuria, hematuria.  No alleviating or aggravating factors. Onset sudden, gradually worsening.   HPI  History reviewed. No pertinent past medical history.  There are no active problems to display for this patient.   No past surgical history on file.      Home Medications    Prior to Admission medications   Medication Sig Start Date End Date Taking? Authorizing Provider  diclofenac (VOLTAREN) 75 MG EC tablet Take 1 tablet (75 mg total) by mouth 2 (two) times daily. 06/19/18   Mardella Layman, MD    Family History No family history on file.  Social History Social History   Tobacco Use  . Smoking status: Current Every Day Smoker  . Smokeless tobacco: Never Used  Substance Use Topics  . Alcohol use: Yes  . Drug use: Yes    Types: Marijuana     Allergies   Patient has no known allergies.   Review of Systems Review of Systems  Constitutional: Positive for chills and fever.  HENT: Positive for congestion, rhinorrhea and sore throat.   Respiratory: Positive for cough.   Gastrointestinal: Positive for diarrhea.  All other systems reviewed and are negative.    Physical Exam Updated Vital Signs BP 127/78 (BP Location: Right Arm)   Pulse 77   Temp 99.3 F (37.4 C)  (Oral)   Resp 17   Ht 5\' 8"  (1.727 m)   Wt 72.6 kg   SpO2 100%   BMI 24.33 kg/m   Physical Exam  Constitutional: He is oriented to person, place, and time. He appears well-developed and well-nourished. No distress.  NAD.  HENT:  Head: Normocephalic and atraumatic.  Right Ear: External ear normal.  Left Ear: External ear normal.  Nose: Nose normal.  Moderate mucosal edema, erythema, rhinorrhea. Septum midline. Oropharynx and tonsils mildly erythematous w/o exudates. Uvula midline. Soft palate flat. No trismus. No sublingual fullness. Normal protrusion of tongue. Normal phonation. No pooling of oral secretions.   Eyes: Conjunctivae and EOM are normal. No scleral icterus.  Neck: Normal range of motion. Neck supple.  Bilateral, symmetric enlarged submandibular lymph nodes, non tender.   Cardiovascular: Normal rate, regular rhythm, normal heart sounds and intact distal pulses.  No murmur heard. Pulmonary/Chest: Effort normal. He has wheezes.  Scant end expiratory wheezing to LLL, cleared with cough. Normal WOB.   Abdominal: Soft. There is no tenderness.  Musculoskeletal: Normal range of motion. He exhibits no deformity.  Neurological: He is alert and oriented to person, place, and time.  Skin: Skin is warm and dry. Capillary refill takes less than 2 seconds.  Psychiatric: He has a normal mood and affect. His behavior is normal. Judgment and thought content normal.  Nursing note and vitals reviewed.    ED Treatments / Results  Labs (all  labs ordered are listed, but only abnormal results are displayed) Labs Reviewed - No data to display  EKG None  Radiology Dg Chest 2 View  Result Date: 07/13/2018 CLINICAL DATA:  Shortness of breath.  Productive cough. EXAM: CHEST - 2 VIEW COMPARISON:  November 05, 2017 FINDINGS: The heart size and mediastinal contours are within normal limits. Both lungs are clear. The visualized skeletal structures are unremarkable. IMPRESSION: No active  cardiopulmonary disease. Electronically Signed   By: Gerome Sam III M.D   On: 07/13/2018 18:47    Procedures Procedures (including critical care time)  Medications Ordered in ED Medications - No data to display   Initial Impression / Assessment and Plan / ED Course  I have reviewed the triage vital signs and the nursing notes.  Pertinent labs & imaging results that were available during my care of the patient were reviewed by me and considered in my medical decision making (see chart for details).     32 y.o. -year-old male with URI symptoms and diarrhea. Known sick contacts. On my exam patient is nontoxic appearing, speaking in full sentences, w/o increased WOB. No fever, tachypnea, tachycardia, hypoxia. CXR negative. Throat exam reassuring and I doubt strep pharyngitis, centor score low. Doubt bacterial bronchitis or pneumonia.  Given reassuring physical exam, will discharge with symptomatic treatment. Strict ED return precautions given. Patient is aware that a viral URI infection may precede pneumonia or worsening illness. Patient is aware of red flag symptoms to monitor for that would warrant return to the ED for further reevaluation.    Final Clinical Impressions(s) / ED Diagnoses   Final diagnoses:  Viral URI with cough    ED Discharge Orders    None       Liberty Handy, PA-C 07/13/18 1903    Pricilla Loveless, MD 07/18/18 [phone]

## 2018-07-13 NOTE — ED Notes (Signed)
Reviewed d/c instructions with pt, who verbalized understanding and had no outstanding questions. Pt departed in NAD, refused use of wheelchair.   

## 2018-07-13 NOTE — ED Notes (Signed)
The pt just returned from xray 

## 2018-07-13 NOTE — ED Notes (Signed)
To x-ray

## 2018-07-13 NOTE — Discharge Instructions (Signed)
You were seen in the ER for cold or flu like symptoms.   Chest x-ray was normal.   I suspect your symptoms are from a virus.    A viral illness typically peaks on day 2-3 and resolves after one week.    The main treatment approach for a viral upper respiratory infection is to treat the symptoms, support your immune system and prevent spread of illness.   Stay well-hydrated. Rest. You can use over the counter medications to help with symptoms: 600 mg ibuprofen (motrin, aleve, advil) or acetaminophen (tylenol) every 6 hours, around the clock to help with associated fevers, sore throat, headaches, generalized body aches and malaise.  Oxymetazoline (afrin) intranasal spray once daily for no more than 3 days to help with congestion, after 3 days you can switch to another over-the-counter nasal steroid spray such as fluticasone (flonase) Allergy medication (loratadine, cetirizine, etc) and phenylephrine (sudafed) help with nasal congestion, runny nose and postnasal drip.   Dextromethorphan (Delsym) to suppress cough without mucus Guaifenesin (mucinex) to help with built up mucus in chest and productive cough Wash your hands often to prevent spread.   Return for worsening symptoms, persistent fever, chest pain, shortness of breath, abdominal pain, blood in vomit or stools

## 2018-07-17 ENCOUNTER — Emergency Department (HOSPITAL_COMMUNITY): Payer: Self-pay

## 2018-07-17 ENCOUNTER — Emergency Department (HOSPITAL_COMMUNITY)
Admission: EM | Admit: 2018-07-17 | Discharge: 2018-07-17 | Disposition: A | Discharge status: Home / Self Care | Payer: Self-pay | Attending: Emergency Medicine | Admitting: Emergency Medicine

## 2018-07-17 DIAGNOSIS — J069 Acute upper respiratory infection, unspecified: Secondary | ICD-10-CM | POA: Insufficient documentation

## 2018-07-17 DIAGNOSIS — R0789 Other chest pain: Secondary | ICD-10-CM | POA: Insufficient documentation

## 2018-07-17 DIAGNOSIS — F17200 Nicotine dependence, unspecified, uncomplicated: Secondary | ICD-10-CM | POA: Insufficient documentation

## 2018-07-17 LAB — BASIC METABOLIC PANEL
Anion gap: 7 (ref 5–15)
BUN: 13 mg/dL (ref 6–20)
CALCIUM: 9.7 mg/dL (ref 8.9–10.3)
CO2: 26 mmol/L (ref 22–32)
Chloride: 105 mmol/L (ref 98–111)
Creatinine, Ser: 1.17 mg/dL (ref 0.61–1.24)
GFR calc Af Amer: >60 mL/min (ref >60)
GLUCOSE: 101 mg/dL — AB (ref 70–99)
Potassium: 3.8 mmol/L (ref 3.5–5.1)
Sodium: 138 mmol/L (ref 135–145)

## 2018-07-17 LAB — CBC
HCT: 45.8 % (ref 39.0–52.0)
Hemoglobin: 14.7 g/dL (ref 13.0–17.0)
MCH: 29.7 pg (ref 26.0–34.0)
MCHC: 32.1 g/dL (ref 30.0–36.0)
MCV: 92.5 fL (ref 80.0–100.0)
Platelets: 268 10*3/uL (ref 150–400)
RBC: 4.95 MIL/uL (ref 4.22–5.81)
RDW: 12.4 % (ref 11.5–15.5)
WBC: 7.9 10*3/uL (ref 4.0–10.5)
nRBC: 0 % (ref 0.0–0.2)

## 2018-07-17 LAB — I-STAT TROPONIN, ED: Troponin i, poc: 0 ng/mL (ref 0.00–0.08)

## 2018-07-17 NOTE — Discharge Instructions (Signed)

## 2018-07-17 NOTE — ED Triage Notes (Signed)
  Patient states he was diagnosed with cold last week and woke up with chest tightness and SOB.  Patient states he sometimes feels dizzy and about to pass out.  Patient wants to make sure he doesn't have the flu.  Patient is A&O x4.  VSS

## 2018-07-17 NOTE — ED Provider Notes (Signed)
[facility] EMERGENCY DEPARTMENT Provider Note   CSN: [id number] Arrival date & time: 07/17/18  0617     History   Chief Complaint Chief Complaint  Patient presents with  . Shortness of Breath  . Chest Pain    HPI [name] is a 32 y.o. male who presents the emergency department with chief complaint of exertional dyspnea.  The patient was diagnosed with a viral URI 4 days ago when he came to the emergency department.  Patient states that since that time he has noticed that he gets chest tightness and shortness of breath when exerting himself.  He is a daily marijuana smoker and has a history of tobacco abuse however is been many years since he smoked cigarettes.  He denies a history of asthma he denies wheezing he denies paroxysm of uncontrolled cough consistent with bronchospasm.  He denies unilateral leg swelling, hemoptysis, chest pain or pleurisy.  The patient has no previous history of DVT or pulmonary embolus.  Patient denies racing or skipping in his heart, he denies melena or hematochezia, he denies abdominal pain, history of GI bleed.  Patient has had no fever but continues to have generalized fatigue and malaise. HPI  No past medical history on file.  There are no active problems to display for this patient.   No past surgical history on file.      Home Medications    Prior to Admission medications   Medication Sig Start Date End Date Taking? Authorizing Provider  diclofenac (VOLTAREN) 75 MG EC tablet Take 1 tablet (75 mg total) by mouth 2 (two) times daily. Patient not taking: Reported on 07/17/2018 06/19/18   Mardella Layman, MD    Family History No family history on file.  Social History Social History   Tobacco Use  . Smoking status: Current Every Day Smoker  . Smokeless tobacco: Never Used  Substance Use Topics  . Alcohol use: Yes  . Drug use: Yes    Types: Marijuana     Allergies   Patient has no known  allergies.   Review of Systems Review of Systems Ten systems reviewed and are negative for acute change, except as noted in the HPI.    Physical Exam Updated Vital Signs BP 130/79 (BP Location: Right Arm)   Pulse 70   Temp 98.3 F (36.8 C) (Oral)   Resp 18   Ht 5\' 8"  (1.727 m)   Wt 72.6 kg   SpO2 100%   BMI 24.33 kg/m   Physical Exam  Constitutional: He is oriented to person, place, and time. He appears well-developed and well-nourished. No distress.  HENT:  Head: Normocephalic and atraumatic.  Eyes: Pupils are equal, round, and reactive to light. Conjunctivae and EOM are normal. No scleral icterus.  Neck: Normal range of motion. Neck supple. No JVD present.  Cardiovascular: Normal rate, regular rhythm, normal heart sounds and intact distal pulses. Exam reveals no friction rub.  No murmur heard. Pulmonary/Chest: Effort normal and breath sounds normal. No tachypnea. No respiratory distress. He has no wheezes. He has no rhonchi. He has no rales.  Abdominal: Soft. There is no tenderness.  Musculoskeletal: He exhibits no edema.  Neurological: He is alert and oriented to person, place, and time.  Skin: Skin is warm and dry. Capillary refill takes less than 2 seconds. He is not diaphoretic.  Psychiatric: His behavior is normal.  Nursing note and vitals reviewed.    ED Treatments / Results  Labs (all labs ordered  are listed, but only abnormal results are displayed) Labs Reviewed  BASIC METABOLIC PANEL - Abnormal; Notable for the following components:      Result Value   Glucose, Bld 101 (*)    All other components within normal limits  CBC  I-STAT TROPONIN, ED    EKG None ED ECG REPORT   Date: 07/18/2018  Rate: 82  Rhythm: normal sinus rhythm  QRS Axis: borderline right axis deviation  Intervals: normal  ST/T Wave abnormalities: normal  Conduction Disutrbances:none  Narrative Interpretation:   Old EKG Reviewed: unchanged  I have personally reviewed the EKG  tracing and agree with the computerized printout as noted.   Radiology Dg Chest 2 View  Result Date: 07/17/2018 CLINICAL DATA:  Chest tightness and shortness of breath. Sometimes feels dizzy. EXAM: CHEST - 2 VIEW COMPARISON:  07/13/2018 FINDINGS: Hyperinflation. The heart size and mediastinal contours are within normal limits. Both lungs are clear. The visualized skeletal structures are unremarkable. IMPRESSION: No active cardiopulmonary disease. Electronically Signed   By: Burman Nieves M.D.   On: 07/17/2018 06:56    Procedures Procedures (including critical care time)  Medications Ordered in ED Medications - No data to display   Initial Impression / Assessment and Plan / ED Course  I have reviewed the triage vital signs and the nursing notes.  Pertinent labs & imaging results that were available during my care of the patient were reviewed by me and considered in my medical decision making (see chart for details).   \ 32 year old male with complaint of chest tightness.  Recent URI.  His EKG is unremarkable for any ischemic abnormalities.  He does not have any chest pain.  He is PERC negative.  Patient has a negative troponin, heart score is 1.  Patient chest x-ray reviewed by me shows no abnormalities and I agree with radiologic impression.  The patient was also able to ambulate in the emergency department without chest pain, shortness of breath or oxygen desaturation.  I feel this is likely symptomatic secondary to his cold.  The patient appears appropriate for discharge.  I discussed return precautions.  Final Clinical Impressions(s) / ED Diagnoses   Final diagnoses:  Feeling of chest tightness  Upper respiratory tract infection, unspecified type    ED Discharge Orders    None       Arthor Captain, PA-C 07/18/18 1610    Margarita Grizzle, MD 07/19/18 [phone]

## 2018-07-17 NOTE — ED Notes (Signed)
No e-signature obtained due to being in hallway. Reviewed d/c paperwork, ambulated patient to exit.

## 2018-07-17 NOTE — ED Notes (Signed)
Pulse ox 100% on RA with ambulation

## 2018-12-08 ENCOUNTER — Encounter (HOSPITAL_COMMUNITY): Payer: Self-pay

## 2018-12-08 ENCOUNTER — Other Ambulatory Visit: Payer: Self-pay

## 2018-12-08 ENCOUNTER — Ambulatory Visit (HOSPITAL_COMMUNITY)
Admission: EM | Admit: 2018-12-08 | Discharge: 2018-12-08 | Disposition: A | Discharge status: Home / Self Care | Payer: Self-pay | Attending: Family Medicine | Admitting: Family Medicine

## 2018-12-08 DIAGNOSIS — H00012 Hordeolum externum right lower eyelid: Secondary | ICD-10-CM

## 2018-12-08 MED ORDER — POLYMYXIN B-TRIMETHOPRIM 10000-0.1 UNIT/ML-% OP SOLN: 1.0000 [drp] | OPHTHALMIC | 0 refills | Status: DC

## 2018-12-08 NOTE — ED Provider Notes (Signed)
[facility]    CSN: [id number] Arrival date & time: 12/08/18  1408     History   Chief Complaint Chief Complaint  Patient presents with  . corner of the right eye.    HPI [name] is a 33 y.o. male.   Patient is a 33 year old male presents today with right eye pain.  The pain is mostly in the corner of the right eye.  This is been going on for approximately 4 days.  He has been using warm compresses without much relief.  Denies any injury to the eye.  Denies any foreign bodies.  He does not wear contacts.  Denies any problems with vision, itchiness or drainage from the eye.  ROS per HPI      History reviewed. No pertinent past medical history.  There are no active problems to display for this patient.   History reviewed. No pertinent surgical history.     Home Medications    Prior to Admission medications   Medication Sig Start Date End Date Taking? Authorizing Provider  diclofenac (VOLTAREN) 75 MG EC tablet Take 1 tablet (75 mg total) by mouth 2 (two) times daily. Patient not taking: Reported on 07/17/2018 06/19/18   Mardella Layman, MD  trimethoprim-polymyxin b (POLYTRIM) ophthalmic solution Place 1 drop into the right eye every 4 (four) hours. 12/08/18   Janace Aris, NP    Family History Family History  Problem Relation Age of Onset  . Diabetes Mother   . Healthy Father     Social History Social History   Tobacco Use  . Smoking status: Current Every Day Smoker  . Smokeless tobacco: Never Used  Substance Use Topics  . Alcohol use: Yes  . Drug use: Yes    Types: Marijuana     Allergies   Patient has no known allergies.   Review of Systems Review of Systems   Physical Exam Triage Vital Signs ED Triage Vitals  Enc Vitals Group     BP 12/08/18 1422 114/82     Pulse Rate 12/08/18 1422 91     Resp 12/08/18 1422 18     Temp 12/08/18 1422 98.8 F (37.1 C)     Temp Source 12/08/18 1422 Oral     SpO2 12/08/18 1422 98 %   Weight 12/08/18 1420 160 lb (72.6 kg)     Height --      Head Circumference --      Peak Flow --      Pain Score 12/08/18 1420 8     Pain Loc --      Pain Edu? --      Excl. in GC? --    No data found.  Updated Vital Signs BP 114/82 (BP Location: Right Arm)   Pulse 91   Temp 98.8 F (37.1 C) (Oral)   Resp 18   Wt 160 lb (72.6 kg)   SpO2 98%   BMI 24.33 kg/m   Visual Acuity Right Eye Distance:   Left Eye Distance:   Bilateral Distance:    Right Eye Near:   Left Eye Near:    Bilateral Near:     Physical Exam Eyes:     General: Lids are normal. Lids are everted, no foreign bodies appreciated.        Right eye: Hordeolum present. No foreign body or discharge.        Left eye: No foreign body, discharge or hordeolum.     Extraocular Movements: Extraocular movements intact.  Conjunctiva/sclera:     Right eye: Right conjunctiva is injected. No chemosis, exudate or hemorrhage.     UC Treatments / Results  Labs (all labs ordered are listed, but only abnormal results are displayed) Labs Reviewed - No data to display  EKG None  Radiology No results found.  Procedures Procedures (including critical care time)  Medications Ordered in UC Medications - No data to display  Initial Impression / Assessment and Plan / UC Course  I have reviewed the triage vital signs and the nursing notes.  Pertinent labs & imaging results that were available during my care of the patient were reviewed by me and considered in my medical decision making (see chart for details).     Stye- will have pt continue the warm compresses and use the eye drops Follow up as needed for continued or worsening symptoms  Final Clinical Impressions(s) / UC Diagnoses   Final diagnoses:  Hordeolum externum of right lower eyelid     Discharge Instructions     Use the drops every 4 hours while awake Continue the warm compresses.  Follow up as needed for continued or worsening symptoms      ED Prescriptions    Medication Sig Dispense Auth. Provider   trimethoprim-polymyxin b (POLYTRIM) ophthalmic solution Place 1 drop into the right eye every 4 (four) hours. 10 mL Dahlia Byes A, NP     Controlled Substance Prescriptions [place] Controlled Substance Registry consulted? Not Applicable   Janace Aris, NP 12/08/18 [phone]

## 2018-12-08 NOTE — ED Triage Notes (Signed)
Pt cc has pain in the corner of his right eye. This has been going on for 4 days. Pt has been doing warm compress.

## 2018-12-08 NOTE — Discharge Instructions (Addendum)
Use the drops every 4 hours while awake Continue the warm compresses.  Follow up as needed for continued or worsening symptoms

## 2019-04-12 ENCOUNTER — Ambulatory Visit (HOSPITAL_COMMUNITY): Admission: EM | Admit: 2019-04-12 | Discharge: 2019-04-12 | Discharge status: Against Medical Advice | Payer: Self-pay

## 2019-04-12 ENCOUNTER — Other Ambulatory Visit: Payer: Self-pay

## 2019-04-14 ENCOUNTER — Encounter (HOSPITAL_COMMUNITY): Payer: Self-pay | Admitting: Emergency Medicine

## 2019-04-14 ENCOUNTER — Ambulatory Visit (HOSPITAL_COMMUNITY)
Admission: EM | Admit: 2019-04-14 | Discharge: 2019-04-14 | Disposition: A | Discharge status: Home / Self Care | Payer: Self-pay | Attending: Internal Medicine | Admitting: Internal Medicine

## 2019-04-14 DIAGNOSIS — R1033 Periumbilical pain: Secondary | ICD-10-CM

## 2019-04-14 MED ORDER — OMEPRAZOLE 20 MG PO CPDR: 20.0000 mg | DELAYED_RELEASE_CAPSULE | Freq: Every day | ORAL | 0 refills | Status: AC

## 2019-04-14 MED ORDER — ALUM & MAG HYDROXIDE-SIMETH 200-200-20 MG/5ML PO SUSP: 15.0000 mL | Freq: Four times a day (QID) | ORAL | 0 refills | Status: AC | PRN

## 2019-04-14 NOTE — Discharge Instructions (Signed)
May try omeprazole daily for the next 2 weeks help with any underlying reflux or gastritis Maalox to supplement as needed when having sharp pains  If pain moving to right lower area, developing fevers, persistent pain, vomiting please follow-up

## 2019-04-14 NOTE — ED Triage Notes (Signed)
Pt c/o lower abdominal pain since Friday. Denies n/v/d.

## 2019-04-14 NOTE — ED Provider Notes (Signed)
[facility]    CSN: [id number] Arrival date & time: 04/14/19  1422      History   Chief Complaint Chief Complaint  Patient presents with  . Abdominal Pain    HPI Unice Baileyntonio D Muns is a 33 y.o. male no significant past medical history presenting today for evaluation abdominal pain.  Patient states that he developed abdominal pain on Friday.  States that it was a sharp intense pain, but this point this resolved on its own.  He felt fine Saturday and Sunday.  This morning when he woke up he had this similar symptoms.  Notes that they are located around his bellybutton.  Felt the pain twice this morning.  Has not returned, denies current pain.  Denies associated nausea or vomiting.  Denies changes in bowel movements, denies diarrhea and constipation.  Has daily bowel movements and denies straining.  When the pain comes on feels as if he needs to curl into a ball.  Denies urinary symptoms of dysuria, increased frequency or hematuria.  Denies penile discharge.  Denies concern for STDs.  HPI  History reviewed. No pertinent past medical history.  There are no active problems to display for this patient.   History reviewed. No pertinent surgical history.     Home Medications    Prior to Admission medications   Medication Sig Start Date End Date Taking? Authorizing Provider  alum & mag hydroxide-simeth (MAALOX/MYLANTA) 200-200-20 MG/5ML suspension Take 15 mLs by mouth every 6 (six) hours as needed for indigestion or heartburn. 04/14/19   Janeice Stegall C, PA-C  omeprazole (PRILOSEC) 20 MG capsule Take 1 capsule (20 mg total) by mouth daily for 14 days. 04/14/19 04/28/19  Chinmayi Rumer C, PA-C  trimethoprim-polymyxin b (POLYTRIM) ophthalmic solution Place 1 drop into the right eye every 4 (four) hours. 12/08/18   Janace ArisBast, Traci A, NP    Family History Family History  Problem Relation Age of Onset  . Diabetes Mother   . Healthy Father     Social History Social History    Tobacco Use  . Smoking status: Current Every Day Smoker  . Smokeless tobacco: Never Used  Substance Use Topics  . Alcohol use: Yes  . Drug use: Yes    Types: Marijuana     Allergies   Patient has no known allergies.   Review of Systems Review of Systems  Constitutional: Negative for fever.  HENT: Negative for sore throat.   Respiratory: Negative for shortness of breath.   Cardiovascular: Negative for chest pain.  Gastrointestinal: Positive for abdominal pain. Negative for nausea and vomiting.  Genitourinary: Negative for difficulty urinating, discharge, dysuria, frequency, penile pain, penile swelling, scrotal swelling and testicular pain.  Skin: Negative for rash.  Neurological: Negative for dizziness, light-headedness and headaches.     Physical Exam Triage Vital Signs ED Triage Vitals  Enc Vitals Group     BP 04/14/19 1435 133/78     Pulse Rate 04/14/19 1435 77     Resp 04/14/19 1435 16     Temp 04/14/19 1435 98.4 F (36.9 C)     Temp src --      SpO2 04/14/19 1435 99 %     Weight --      Height --      Head Circumference --      Peak Flow --      Pain Score 04/14/19 1436 5     Pain Loc --      Pain Edu? --  Excl. in GC? --    No data found.  Updated Vital Signs BP 133/78   Pulse 77   Temp 98.4 F (36.9 C)   Resp 16   SpO2 99%   Visual Acuity Right Eye Distance:   Left Eye Distance:   Bilateral Distance:    Right Eye Near:   Left Eye Near:    Bilateral Near:     Physical Exam Vitals signs and nursing note reviewed.  Constitutional:      Appearance: He is well-developed.  HENT:     Head: Normocephalic and atraumatic.  Eyes:     Conjunctiva/sclera: Conjunctivae normal.  Neck:     Musculoskeletal: Neck supple.  Cardiovascular:     Rate and Rhythm: Normal rate and regular rhythm.     Heart sounds: No murmur.  Pulmonary:     Effort: Pulmonary effort is normal. No respiratory distress.     Breath sounds: Normal breath sounds.      Comments: Breathing comfortably at rest, CTABL, no wheezing, rales or other adventitious sounds auscultated Abdominal:     Palpations: Abdomen is soft.     Tenderness: There is no abdominal tenderness.     Comments: Soft, nondistended, nontender to light and deep palpation throughout entire abdomen  Changing positions without worsening pain  Musculoskeletal:     Comments: Normal gait  Skin:    General: Skin is warm and dry.  Neurological:     Mental Status: He is alert.      UC Treatments / Results  Labs (all labs ordered are listed, but only abnormal results are displayed) Labs Reviewed - No data to display  EKG   Radiology No results found.  Procedures Procedures (including critical care time)  Medications Ordered in UC Medications - No data to display  Initial Impression / Assessment and Plan / UC Course  I have reviewed the triage vital signs and the nursing notes.  Pertinent labs & imaging results that were available during my care of the patient were reviewed by me and considered in my medical decision making (see chart for details).     Vital signs stable, exam benign, and nontender on exam, currently without pain.  Unclear cause of discomfort.  Discussed possible constipation etiology and recommendations of fiber and fluids.  Discussed possible reflux/gastritis.  Do not suspect underlying appendicitis or cholecystitis at this time.  Discussed strict return precautions. Patient verbalized understanding and is agreeable with plan.  Final Clinical Impressions(s) / UC Diagnoses   Final diagnoses:  Periumbilical abdominal pain     Discharge Instructions     May try omeprazole daily for the next 2 weeks help with any underlying reflux or gastritis Maalox to supplement as needed when having sharp pains  If pain moving to right lower area, developing fevers, persistent pain, vomiting please follow-up   ED Prescriptions    Medication Sig Dispense Auth.  Provider   alum & mag hydroxide-simeth (MAALOX/MYLANTA) 200-200-20 MG/5ML suspension Take 15 mLs by mouth every 6 (six) hours as needed for indigestion or heartburn. 355 mL Hopie Pellegrin C, PA-C   omeprazole (PRILOSEC) 20 MG capsule Take 1 capsule (20 mg total) by mouth daily for 14 days. 14 capsule Jabri Blancett C, PA-C     Controlled Substance Prescriptions [place] Controlled Substance Registry consulted? Not Applicable   Janith Lima, [place] 04/14/19 1502

## 2019-04-30 ENCOUNTER — Other Ambulatory Visit: Payer: Self-pay

## 2019-04-30 ENCOUNTER — Encounter (HOSPITAL_COMMUNITY): Payer: Self-pay

## 2019-04-30 ENCOUNTER — Ambulatory Visit (HOSPITAL_COMMUNITY)
Admission: EM | Admit: 2019-04-30 | Discharge: 2019-04-30 | Disposition: A | Discharge status: Home / Self Care | Payer: Self-pay | Attending: Family Medicine | Admitting: Family Medicine

## 2019-04-30 DIAGNOSIS — M545 Low back pain, unspecified: Secondary | ICD-10-CM

## 2019-04-30 MED ORDER — CYCLOBENZAPRINE HCL 10 MG PO TABS: 10.0000 mg | ORAL_TABLET | Freq: Two times a day (BID) | ORAL | 0 refills | Status: DC | PRN

## 2019-04-30 NOTE — ED Provider Notes (Signed)
[facility]    CSN: [id number] Arrival date & time: 04/30/19  0830      History   Chief Complaint Chief Complaint  Patient presents with  . Back Pain    HPI [name] is a 33 y.o. male.   Patient presents with right sided low back pain.  He denies radicular symptoms.  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  He is being told by his employer that he needs a doctor's note to return to work.  There is a history of back strain in the past.  HPI  History reviewed. No pertinent past medical history.  There are no active problems to display for this patient.   History reviewed. No pertinent surgical history.     Home Medications    Prior to Admission medications   Medication Sig Start Date End Date Taking? Authorizing Provider  alum & mag hydroxide-simeth (MAALOX/MYLANTA) 200-200-20 MG/5ML suspension Take 15 mLs by mouth every 6 (six) hours as needed for indigestion or heartburn. 04/14/19   Wieters, Hallie C, PA-C  omeprazole (PRILOSEC) 20 MG capsule Take 1 capsule (20 mg total) by mouth daily for 14 days. 04/14/19 04/28/19  Wieters, Hallie C, PA-C  trimethoprim-polymyxin b (POLYTRIM) ophthalmic solution Place 1 drop into the right eye every 4 (four) hours. 12/08/18   Janace ArisBast, Traci A, NP    Family History Family History  Problem Relation Age of Onset  . Diabetes Mother   . Healthy Father     Social History Social History   Tobacco Use  . Smoking status: Current Every Day Smoker  . Smokeless tobacco: Never Used  Substance Use Topics  . Alcohol use: Yes  . Drug use: Yes    Types: Marijuana     Allergies   Patient has no known allergies.   Review of Systems Review of Systems  Constitutional: Negative.   HENT: Negative.   Respiratory: Negative.   Gastrointestinal: Negative.   Musculoskeletal: Positive for back pain.  Neurological: Negative.   Psychiatric/Behavioral: Negative.      Physical Exam  Triage Vital Signs ED Triage Vitals  Enc Vitals Group     BP 04/30/19 0913 113/70     Pulse Rate 04/30/19 0913 66     Resp 04/30/19 0913 16     Temp 04/30/19 0913 97.7 F (36.5 C)     Temp Source 04/30/19 0913 Oral     SpO2 04/30/19 0913 100 %     Weight 04/30/19 0912 155 lb (70.3 kg)     Height --      Head Circumference --      Peak Flow --      Pain Score 04/30/19 0912 8     Pain Loc --      Pain Edu? --      Excl. in GC? --    No data found.  Updated Vital Signs BP 113/70 (BP Location: Right Arm)   Pulse 66   Temp 97.7 F (36.5 C) (Oral)   Resp 16   Wt 70.3 kg   SpO2 100%   BMI 23.57 kg/m   Visual Acuity Right Eye Distance:   Left Eye Distance:   Bilateral Distance:    Right Eye Near:   Left Eye Near:    Bilateral Near:     Physical Exam Constitutional:      Appearance: Normal appearance. He is normal weight.  Cardiovascular:     Pulses:  Normal pulses.     Heart sounds: Normal heart sounds.  Pulmonary:     Effort: Pulmonary effort is normal.     Breath sounds: Normal breath sounds.  Musculoskeletal:     Comments: Back exam: There is decreased range of motion in all planes.  He reports more pain with extension. Straight leg raising is negative. Deep tendon reflexes are symmetric in the lower extremities. Gait appears normal with normal heel and toe walking.  Neurological:     Mental Status: He is alert.      UC Treatments / Results  Labs (all labs ordered are listed, but only abnormal results are displayed) Labs Reviewed - No data to display  EKG   Radiology No results found.  Procedures Procedures (including critical care time)  Medications Ordered in UC Medications - No data to display  Initial Impression / Assessment and Plan / UC Course  I have reviewed the triage vital signs and the nursing notes.  Pertinent labs & imaging results that were available during my care of the patient were reviewed by me and considered in my medical  decision making (see chart for details).     Mechanical low back strain.  Should get better with time.  Will initiate exercises and muscle relaxants and provide work note with restrictions Final Clinical Impressions(s) / UC Diagnoses   Final diagnoses:  None   Discharge Instructions   None    ED Prescriptions    None     Controlled Substance Prescriptions [place] Controlled Substance Registry consulted? No   Wardell Honour, MD 04/30/19 [phone]

## 2019-04-30 NOTE — ED Triage Notes (Signed)
Pt states he has back pain. 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 This has been going on for a week. Pt states it's getting worst.

## 2019-05-05 ENCOUNTER — Encounter: Payer: Self-pay | Admitting: Family Medicine

## 2019-05-05 ENCOUNTER — Other Ambulatory Visit: Payer: Self-pay

## 2019-05-05 ENCOUNTER — Ambulatory Visit (INDEPENDENT_AMBULATORY_CARE_PROVIDER_SITE_OTHER): Payer: Self-pay | Admitting: Family Medicine

## 2019-05-05 ENCOUNTER — Ambulatory Visit (HOSPITAL_COMMUNITY)
Admission: RE | Admit: 2019-05-05 | Discharge: 2019-05-05 | Disposition: A | Discharge status: Home / Self Care | Payer: Self-pay | Source: Ambulatory Visit | Attending: Family Medicine | Admitting: Family Medicine

## 2019-05-05 VITALS — BP 119/74 | HR 86 | Temp 98.1°F | Resp 20 | Ht 68.0 in | Wt 138.0 lb

## 2019-05-05 DIAGNOSIS — R5383 Other fatigue: Secondary | ICD-10-CM

## 2019-05-05 DIAGNOSIS — G8929 Other chronic pain: Secondary | ICD-10-CM | POA: Insufficient documentation

## 2019-05-05 DIAGNOSIS — M545 Low back pain: Secondary | ICD-10-CM

## 2019-05-05 DIAGNOSIS — M549 Dorsalgia, unspecified: Secondary | ICD-10-CM | POA: Insufficient documentation

## 2019-05-05 MED ORDER — KETOROLAC TROMETHAMINE 30 MG/ML IJ SOLN: 30.0000 mg | Freq: Once | INTRAMUSCULAR | Status: AC

## 2019-05-05 MED ORDER — MELOXICAM 7.5 MG PO TABS: 7.5000 mg | ORAL_TABLET | Freq: Every day | ORAL | 0 refills | Status: DC

## 2019-05-05 MED FILL — MELOXICAM 7.5 MG TABLET: 7.5 | 30 days supply | Qty: 30 | Fill #0

## 2019-05-05 NOTE — Patient Instructions (Signed)
You will get an xray of lower back on today.  Please get information for orange card program for financial assistance.  You will start Meloxicam 7.5 mg daily with food for back pain Flexeril 10 mg at bed time as needed for muscle spasms   Chronic Back Pain When back pain lasts longer than 3 months, it is called chronic back pain. Pain may get worse at certain times (flare-ups). There are things you can do at home to manage your pain. Follow these instructions at home: Activity      Avoid bending and other activities that make pain worse.  When standing: ? Keep your upper back and neck straight. ? Keep your shoulders pulled back. ? Avoid slouching.  When sitting: ? Keep your back straight. ? Relax your shoulders. Do not round your shoulders or pull them backward.  Do not sit or stand in one place for long periods of time.  Take short rest breaks during the day. Lying down or standing is usually better than sitting. Resting can help relieve pain.  When sitting or lying down for a long time, do some mild activity or stretching. This will help to prevent stiffness and pain.  Get regular exercise. Ask your doctor what activities are safe for you.  Do not lift anything that is heavier than 10 lb (4.5 kg). To prevent injury when you lift things: ? Bend your knees. ? Keep the weight close to your body. ? Avoid twisting. Managing pain  If told, put ice on the painful area. Your doctor may tell you to use ice for 24-48 hours after a flare-up starts. ? Put ice in a plastic bag. ? Place a towel between your skin and the bag. ? Leave the ice on for 20 minutes, 2-3 times a day.  If told, put heat on the painful area as often as told by your doctor. Use the heat source that your doctor recommends, such as a moist heat pack or a heating pad. ? Place a towel between your skin and the heat source. ? Leave the heat on for 20-30 minutes. ? Remove the heat if your skin turns bright red.  This is especially important if you are unable to feel pain, heat, or cold. You may have a greater risk of getting burned.  Soak in a warm bath. This can help relieve pain.  Take over-the-counter and prescription medicines only as told by your doctor. General instructions  Sleep on a firm mattress. Try lying on your side with your knees slightly bent. If you lie on your back, put a pillow under your knees.  Keep all follow-up visits as told by your doctor. This is important. Contact a doctor if:  You have pain that does not get better with rest or medicine. Get help right away if:  One or both of your arms or legs feel weak.  One or both of your arms or legs lose feeling (numbness).  You have trouble controlling when you poop (bowel movement) or pee (urinate).  You feel sick to your stomach (nauseous).  You throw up (vomit).  You have belly (abdominal) pain.  You have shortness of breath.  You pass out (faint). Summary  When back pain lasts longer than 3 months, it is called chronic back pain.  Pain may get worse at certain times (flare-ups).  Use ice and heat as told by your doctor. Your doctor may tell you to use ice after flare-ups. This information is not intended to  replace advice given to you by your health care provider. Make sure you discuss any questions you have with your health care provider. Document Released: 02/13/2008 Document Revised: 12/18/2018 Document Reviewed: 04/11/2017 Elsevier Patient Education  2020 [employer].

## 2019-05-05 NOTE — Progress Notes (Signed)
New Patient Office Visit  Subjective:  Patient ID: [name], male    DOB: [birth year]  Age: 33 y.o. MRN: [id number]  CC: Back pain for 5-6 months HPI [name], a 33 year old male presents to establish care.  He states that he has not had a primary care provider due to financial and insurance constraints.  He mostly uses the ER for all primary care issues.  Patient is complaining of low back pain for greater than 5 minutes..  He states that he works a very physical job and typically has daily back pain.  Pain intensity is 6-7/10 characterized as constant and aching.   Symptoms are worst in am and at the end of work day.  Patient has not identified any alleviating factors for chronic back pain.  Exacerbating factors identifiable by patient are bending sideways, recumbency, sitting and standing.  Patient states that he started muscle relaxers in the past without sustained relief.  He has not attempted any over-the-counter analgesics for pain control.  He denies incontinence of urine or stool.  He denies headache, chest pain, shortness of breath, nausea, vomiting, or diarrhea.  Patient is also complaining of increased fatigue over the past several months. Sentinal symptom the patient feels fatigue began with weight loss and feeling tired.  fever and symptoms of arthritis. Symptoms of his fatigue have been change in appetite, diffuse soft tissue aches and pains, general malaise and lack of interest in usual activities.  Patient denies fever, unusual rashes, cold intolerance, constipation and change in hair texture., GI blood loss and witnessed or suspected sleep apnea.   No past medical history on file.  No past surgical history on file.  Family History  Problem Relation Age of Onset  . Diabetes Mother   . Healthy Father     Social History   Socioeconomic History  . Marital status: Single    Spouse name: Not on file  . Number of children: Not on file  . Years of education:  Not on file  . Highest education level: Not on file  Occupational History  . Not on file  Social Needs  . Financial resource strain: Not on file  . Food insecurity    Worry: Not on file    Inability: Not on file  . Transportation needs    Medical: Not on file    Non-medical: Not on file  Tobacco Use  . Smoking status: Current Every Day Smoker  . Smokeless tobacco: Never Used  Substance and Sexual Activity  . Alcohol use: Yes  . Drug use: Yes    Types: Marijuana  . Sexual activity: Not on file  Lifestyle  . Physical activity    Days per week: Not on file    Minutes per session: Not on file  . Stress: Not on file  Relationships  . Social Musicianconnections    Talks on phone: Not on file    Gets together: Not on file    Attends religious service: Not on file    Active member of club or organization: Not on file    Attends meetings of clubs or organizations: Not on file    Relationship status: Not on file  . Intimate partner violence    Fear of current or ex partner: Not on file    Emotionally abused: Not on file    Physically abused: Not on file    Forced sexual activity: Not on file  Other Topics Concern  . Not on file  Social History Narrative  . Not on file    ROS Review of Systems  Objective:   Today's Vitals: BP 119/74 (BP Location: Left Arm, Patient Position: Sitting, Cuff Size: Small)   Pulse 86   Temp 98.1 F (36.7 C) (Oral)   Resp 20   Ht 5\' 8"  (1.727 m)   Wt 138 lb (62.6 kg)   SpO2 100%   BMI 20.98 kg/m   Physical Exam Constitutional:      Appearance: Normal appearance.  HENT:     Head: Normocephalic.     Mouth/Throat:     Mouth: Mucous membranes are moist.  Cardiovascular:     Rate and Rhythm: Normal rate and regular rhythm.     Pulses: Normal pulses.     Heart sounds: Normal heart sounds.  Abdominal:     General: Bowel sounds are normal.  Musculoskeletal:     Lumbar back: He exhibits decreased range of motion, tenderness and pain.  Skin:     General: Skin is warm.  Neurological:     General: No focal deficit present.     Mental Status: He is alert and oriented to person, place, and time. Mental status is at baseline.     Gait: Gait normal.  Psychiatric:        Mood and Affect: Mood normal.        Behavior: Behavior normal.        Thought Content: Thought content normal.        Judgment: Judgment normal.     Assessment & Plan:   Problem List Items Addressed This Visit      Other   Fatigue   Relevant Orders   CBC with Differential   Comprehensive metabolic panel   Chronic back pain - Primary   Relevant Medications   ketorolac (TORADOL) 30 MG/ML injection 30 mg   meloxicam (MOBIC) 7.5 MG tablet   Other Relevant Orders   Urinalysis Dipstick   CBC with Differential   Comprehensive metabolic panel   DG Lumbar Spine Complete      Outpatient Encounter Medications as of 05/05/2019  Medication Sig  . alum & mag hydroxide-simeth (MAALOX/MYLANTA) 200-200-20 MG/5ML suspension Take 15 mLs by mouth every 6 (six) hours as needed for indigestion or heartburn.  . cyclobenzaprine (FLEXERIL) 10 MG tablet Take 1 tablet (10 mg total) by mouth 2 (two) times daily as needed for muscle spasms.  . meloxicam (MOBIC) 7.5 MG tablet Take 1 tablet (7.5 mg total) by mouth daily.  Marland Kitchen omeprazole (PRILOSEC) 20 MG capsule Take 1 capsule (20 mg total) by mouth daily for 14 days.  Marland Kitchen trimethoprim-polymyxin b (POLYTRIM) ophthalmic solution Place 1 drop into the right eye every 4 (four) hours.   Facility-Administered Encounter Medications as of 05/05/2019  Medication  . ketorolac (TORADOL) 30 MG/ML injection 30 mg   Chronic bilateral low back pain without sciatica Recommend that patient refrains from lifting items greater than 25 pounds. Follow up in clinic in 1 month. Will review back xrays as results become available.  - ketorolac (TORADOL) 30 MG/ML injection 30 mg - CBC with Differential - Comprehensive metabolic panel - DG Lumbar Spine  Complete; Future - meloxicam (MOBIC) 7.5 MG tablet; Take 1 tablet (7.5 mg total) by mouth daily.  Dispense: 30 tablet; Refill: 0 - Urinalysis, Routine w reflex microscopic   Fatigue, unspecified type - CBC with Differential - Comprehensive metabolic panel   Follow-up: Return in about 1 month (around 06/05/2019) for back pain.   Lovett Sox  Rennis PettyMoore Hillarie Harrigan  APRN, MSN, FNP-C [facility] [address]  [place], [place] [id number] [phone]

## 2019-05-06 ENCOUNTER — Telehealth: Payer: Self-pay

## 2019-05-06 LAB — CBC WITH DIFFERENTIAL/PLATELET
Basophils Absolute: 0 10*3/uL (ref 0.0–0.2)
Basos: 1 %
EOS (ABSOLUTE): 0 10*3/uL (ref 0.0–0.4)
Eos: 1 %
Hematocrit: 42.6 % (ref 37.5–51.0)
Hemoglobin: 14.3 g/dL (ref 13.0–17.7)
Immature Grans (Abs): 0 10*3/uL (ref 0.0–0.1)
Immature Granulocytes: 0 %
Lymphocytes Absolute: 1.7 10*3/uL (ref 0.7–3.1)
Lymphs: 25 %
MCH: 30.8 pg (ref 26.6–33.0)
MCHC: 33.6 g/dL (ref 31.5–35.7)
MCV: 92 fL (ref 79–97)
Monocytes Absolute: 0.5 10*3/uL (ref 0.1–0.9)
Monocytes: 8 %
Neutrophils Absolute: 4.4 10*3/uL (ref 1.4–7.0)
Neutrophils: 65 %
Platelets: 255 10*3/uL (ref 150–450)
RBC: 4.64 x10E6/uL (ref 4.14–5.80)
RDW: 12.4 % (ref 11.6–15.4)
WBC: 6.7 10*3/uL (ref 3.4–10.8)

## 2019-05-06 LAB — COMPREHENSIVE METABOLIC PANEL
ALT: 34 IU/L (ref 0–44)
AST: 28 IU/L (ref 0–40)
Albumin/Globulin Ratio: 1.9 (ref 1.2–2.2)
Albumin: 4.9 g/dL (ref 4.0–5.0)
Alkaline Phosphatase: 69 IU/L (ref 39–117)
BUN/Creatinine Ratio: 12 (ref 9–20)
BUN: 13 mg/dL (ref 6–20)
Bilirubin Total: 0.9 mg/dL (ref 0.0–1.2)
CO2: 25 mmol/L (ref 20–29)
Calcium: 10 mg/dL (ref 8.7–10.2)
Chloride: 100 mmol/L (ref 96–106)
Creatinine, Ser: 1.07 mg/dL (ref 0.76–1.27)
GFR calc Af Amer: 106 mL/min/{1.73_m2} (ref >59)
GFR calc non Af Amer: 92 mL/min/{1.73_m2} (ref >59)
Globulin, Total: 2.6 g/dL (ref 1.5–4.5)
Glucose: 99 mg/dL (ref 65–99)
Potassium: 3.9 mmol/L (ref 3.5–5.2)
Sodium: 140 mmol/L (ref 134–144)
Total Protein: 7.5 g/dL (ref 6.0–8.5)

## 2019-05-06 LAB — MICROSCOPIC EXAMINATION
Bacteria, UA: NONE SEEN
Casts: NONE SEEN /lpf

## 2019-05-06 LAB — URINALYSIS, ROUTINE W REFLEX MICROSCOPIC
Bilirubin, UA: NEGATIVE
Glucose, UA: NEGATIVE
Ketones, UA: NEGATIVE
Leukocytes,UA: NEGATIVE
Nitrite, UA: NEGATIVE
Specific Gravity, UA: 1.02 (ref 1.005–1.030)
Urobilinogen, Ur: 1 mg/dL (ref 0.2–1.0)
pH, UA: 6 (ref 5.0–7.5)

## 2019-05-06 NOTE — Telephone Encounter (Signed)
Called, no answer and no voicemail. Will try later.  

## 2019-05-06 NOTE — Telephone Encounter (Signed)
-----   Message from Dorena Dew, [place] sent at 05/06/2019  1:00 PM EDT ----- Regarding: lab results Please inform patient that back xray shows no acute findings and is unremarkable. All laboratory values are within normal limits. Please take medications as prescribed and follow up as scheduled.   Donia Pounds  APRN, MSN, FNP-C [facility] [address] [place], [place] [address] [phone]

## 2019-05-07 LAB — TSH: TSH: 1.36 u[IU]/mL (ref 0.450–4.500)

## 2019-05-07 LAB — SPECIMEN STATUS REPORT

## 2019-05-07 NOTE — Telephone Encounter (Signed)
Called, no answer. No voicemail.  

## 2019-06-09 ENCOUNTER — Encounter: Payer: Self-pay | Admitting: Family Medicine

## 2019-06-09 ENCOUNTER — Ambulatory Visit (INDEPENDENT_AMBULATORY_CARE_PROVIDER_SITE_OTHER): Payer: Self-pay | Admitting: Family Medicine

## 2019-06-09 ENCOUNTER — Other Ambulatory Visit: Payer: Self-pay

## 2019-06-09 VITALS — BP 119/61 | HR 62 | Temp 98.5°F | Resp 14 | Ht 68.0 in | Wt 137.0 lb

## 2019-06-09 DIAGNOSIS — F1290 Cannabis use, unspecified, uncomplicated: Secondary | ICD-10-CM | POA: Insufficient documentation

## 2019-06-09 DIAGNOSIS — M62838 Other muscle spasm: Secondary | ICD-10-CM

## 2019-06-09 DIAGNOSIS — M545 Low back pain, unspecified: Secondary | ICD-10-CM

## 2019-06-09 DIAGNOSIS — G8929 Other chronic pain: Secondary | ICD-10-CM

## 2019-06-09 MED ORDER — CYCLOBENZAPRINE HCL 10 MG PO TABS: 10.0000 mg | ORAL_TABLET | Freq: Two times a day (BID) | ORAL | 0 refills | Status: DC | PRN

## 2019-06-09 MED ORDER — KETOROLAC TROMETHAMINE 30 MG/ML IJ SOLN
30.0000 mg | Freq: Once | INTRAMUSCULAR | Status: AC
  Administered 2019-06-09: 10:00:00 30 mg via INTRAMUSCULAR

## 2019-06-09 NOTE — Patient Instructions (Signed)
Continue 25 pound weight limit.  Also, cyclobenzaprine for back spasms. Chronic Back Pain When back pain lasts longer than 3 months, it is called chronic back pain. Pain may get worse at certain times (flare-ups). There are things you can do at home to manage your pain. Follow these instructions at home: Activity      Avoid bending and other activities that make pain worse.  When standing: ? Keep your upper back and neck straight. ? Keep your shoulders pulled back. ? Avoid slouching.  When sitting: ? Keep your back straight. ? Relax your shoulders. Do not round your shoulders or pull them backward.  Do not sit or stand in one place for long periods of time.  Take short rest breaks during the day. Lying down or standing is usually better than sitting. Resting can help relieve pain.  When sitting or lying down for a long time, do some mild activity or stretching. This will help to prevent stiffness and pain.  Get regular exercise. Ask your doctor what activities are safe for you.  Do not lift anything that is heavier than 10 lb (4.5 kg). To prevent injury when you lift things: ? Bend your knees. ? Keep the weight close to your body. ? Avoid twisting. Managing pain  If told, put ice on the painful area. Your doctor may tell you to use ice for 24-48 hours after a flare-up starts. ? Put ice in a plastic bag. ? Place a towel between your skin and the bag. ? Leave the ice on for 20 minutes, 2-3 times a day.  If told, put heat on the painful area as often as told by your doctor. Use the heat source that your doctor recommends, such as a moist heat pack or a heating pad. ? Place a towel between your skin and the heat source. ? Leave the heat on for 20-30 minutes. ? Remove the heat if your skin turns bright red. This is especially important if you are unable to feel pain, heat, or cold. You may have a greater risk of getting burned.  Soak in a warm bath. This can help relieve pain.   Take over-the-counter and prescription medicines only as told by your doctor. General instructions  Sleep on a firm mattress. Try lying on your side with your knees slightly bent. If you lie on your back, put a pillow under your knees.  Keep all follow-up visits as told by your doctor. This is important. Contact a doctor if:  You have pain that does not get better with rest or medicine. Get help right away if:  One or both of your arms or legs feel weak.  One or both of your arms or legs lose feeling (numbness).  You have trouble controlling when you poop (bowel movement) or pee (urinate).  You feel sick to your stomach (nauseous).  You throw up (vomit).  You have belly (abdominal) pain.  You have shortness of breath.  You pass out (faint). Summary  When back pain lasts longer than 3 months, it is called chronic back pain.  Pain may get worse at certain times (flare-ups).  Use ice and heat as told by your doctor. Your doctor may tell you to use ice after flare-ups. This information is not intended to replace advice given to you by your health care provider. Make sure you discuss any questions you have with your health care provider. Document Released: 02/13/2008 Document Revised: 12/18/2018 Document Reviewed: 04/11/2017 Elsevier Patient Education  2020 Elsevier Inc.  

## 2019-06-09 NOTE — Progress Notes (Signed)
Established Patient Office Visit  Subjective:  Patient ID: [name], male    DOB: 27-Jul-1986  Age: 33 y.o. MRN: [id number]  CC:  Chief Complaint  Patient presents with  . Back Pain    1 month follow up     HPI [name] presents for follow-up of chronic back pain.  Patient was seen for chronic back pain 1 month ago and was started on meloxicam and cyclobenzaprine.  Patient was also on 25 pound restriction at work.  He states that pain intensity has improved with those interventions.  Today, he states that pain is 7/10, constant, and aching.  He says that he has not taken medications over the past several days..Patient's back pain is been present for greater than 1 year.  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  Pain is worsened by standing for long periods of time, walking up and down stairs, lifting heavy objects, and transitioning from sitting to standing.  Alleviating factors by the patient  Alleviating factors identifiable by patient are medications and rest.  Patient denies incontinence of urine or stool.  He also denies fatigue, shortness of breath, nausea, vomiting, or diarrhea.    History reviewed. No pertinent past medical history.  History reviewed. No pertinent surgical history.  Family History  Problem Relation Age of Onset  . Diabetes Mother   . Healthy Father     Social History   Socioeconomic History  . Marital status: Single    Spouse name: Not on file  . Number of children: Not on file  . Years of education: Not on file  . Highest education level: Not on file  Occupational History  . Not on file  Social Needs  . Financial resource strain: Not on file  . Food insecurity    Worry: Not on file    Inability: Not on file  . Transportation needs    Medical: Not on file    Non-medical: Not on file  Tobacco Use  . Smoking status: Current Every Day Smoker  . Smokeless tobacco: Never Used  Substance and Sexual  Activity  . Alcohol use: Yes  . Drug use: Yes    Frequency: 3.0 times per week    Types: Marijuana  . Sexual activity: Yes    Partners: Female    Birth control/protection: Condom  Lifestyle  . Physical activity    Days per week: Not on file    Minutes per session: Not on file  . Stress: Not on file  Relationships  . Social Musicianconnections    Talks on phone: Not on file    Gets together: Not on file    Attends religious service: Not on file    Active member of club or organization: Not on file    Attends meetings of clubs or organizations: Not on file    Relationship status: Not on file  . Intimate partner violence    Fear of current or ex partner: Not on file    Emotionally abused: Not on file    Physically abused: Not on file    Forced sexual activity: Not on file  Other Topics Concern  . Not on file  Social History Narrative  . Not on file    Outpatient Medications Prior to Visit  Medication Sig Dispense Refill  . alum & mag hydroxide-simeth (MAALOX/MYLANTA) 200-200-20 MG/5ML suspension Take 15 mLs by mouth every 6 (six) hours as needed for indigestion or heartburn. 355  mL 0  . meloxicam (MOBIC) 7.5 MG tablet Take 1 tablet (7.5 mg total) by mouth daily. 30 tablet 0  . omeprazole (PRILOSEC) 20 MG capsule Take 1 capsule (20 mg total) by mouth daily for 14 days. 14 capsule 0  . cyclobenzaprine (FLEXERIL) 10 MG tablet Take 1 tablet (10 mg total) by mouth 2 (two) times daily as needed for muscle spasms. 20 tablet 0  . trimethoprim-polymyxin b (POLYTRIM) ophthalmic solution Place 1 drop into the right eye every 4 (four) hours. 10 mL 0   No facility-administered medications prior to visit.     No Known Allergies  ROS Review of Systems  Constitutional: Negative for diaphoresis and fatigue.  HENT: Negative.   Eyes: Negative.   Respiratory: Negative.   Cardiovascular: Negative.   Gastrointestinal: Negative.   Endocrine: Negative.   Genitourinary: Negative.    Musculoskeletal: Positive for back pain.  Neurological: Negative.   Hematological: Negative.   Psychiatric/Behavioral: Negative.       Objective:    Physical Exam  Constitutional: He is oriented to person, place, and time. He appears well-developed and well-nourished.  HENT:  Head: Normocephalic.  Eyes: Pupils are equal, round, and reactive to light.  Neck: Normal range of motion.  Cardiovascular: Normal rate.  Pulmonary/Chest: Effort normal.  Abdominal: Soft. Bowel sounds are normal.  Musculoskeletal:     Lumbar back: He exhibits decreased range of motion, tenderness, pain and spasm.  Neurological: He is alert and oriented to person, place, and time.  Skin: Skin is warm.  Psychiatric: He has a normal mood and affect. His behavior is normal. Judgment and thought content normal.    BP 119/61 (BP Location: Left Arm, Patient Position: Sitting, Cuff Size: Normal)   Pulse 62   Temp 98.5 F (36.9 C) (Oral)   Resp 14   Ht 5\' 8"  (1.727 m)   Wt 137 lb (62.1 kg)   SpO2 100%   BMI 20.83 kg/m  Wt Readings from Last 3 Encounters:  06/09/19 137 lb (62.1 kg)  05/05/19 138 lb (62.6 kg)  04/30/19 155 lb (70.3 kg)     Health Maintenance Due  Topic Date Due  . HIV Screening  02/21/2003  . TETANUS/TDAP  02/21/2007    There are no preventive care reminders to display for this patient.  Lab Results  Component Value Date   TSH 1.360 05/05/2019   Lab Results  Component Value Date   WBC 6.7 05/05/2019   HGB 14.3 05/05/2019   HCT 42.6 05/05/2019   MCV 92 05/05/2019   PLT 255 05/05/2019   Lab Results  Component Value Date   NA 140 05/05/2019   K 3.9 05/05/2019   CO2 25 05/05/2019   GLUCOSE 99 05/05/2019   BUN 13 05/05/2019   CREATININE 1.07 05/05/2019   BILITOT 0.9 05/05/2019   ALKPHOS 69 05/05/2019   AST 28 05/05/2019   ALT 34 05/05/2019   PROT 7.5 05/05/2019   ALBUMIN 4.9 05/05/2019   CALCIUM 10.0 05/05/2019   ANIONGAP 7 07/17/2018   No results found for:  CHOL No results found for: HDL No results found for: LDLCALC No results found for: TRIG No results found for: CHOLHDL No results found for: HGBA1C    Assessment & Plan:   Problem List Items Addressed This Visit      Other   Marijuana user   Chronic back pain - Primary   Relevant Medications   cyclobenzaprine (FLEXERIL) 10 MG tablet    Other Visit Diagnoses  Muscle spasms of both lower extremities       Relevant Medications   cyclobenzaprine (FLEXERIL) 10 MG tablet      Meds ordered this encounter  Medications  . ketorolac (TORADOL) 30 MG/ML injection 30 mg  . cyclobenzaprine (FLEXERIL) 10 MG tablet    Sig: Take 1 tablet (10 mg total) by mouth 2 (two) times daily as needed for muscle spasms.    Dispense:  20 tablet    Refill:  0    Order Specific Question:   Supervising Provider    Answer:   Jeanann Lewandowsky E [id number]   1. Chronic bilateral low back pain without sciatica Continue Meloxicam daily with food. Also, Toradol injection received without complication - ketorolac (TORADOL) 30 MG/ML injection 30 mg  2. Muscle spasms of both lower extremities - cyclobenzaprine (FLEXERIL) 10 MG tablet; Take 1 tablet (10 mg total) by mouth 2 (two) times daily as needed for muscle spasms.  Dispense: 20 tablet; Refill: 0  3. Marijuana user Discussed at length. Patient says that he typically smokes weekly.   Follow-up:  Follow up as needed or if back pain worsens or persists    Nolon Nations  APRN, MSN, FNP-C [facility] [address] [place], [place] [address] [phone]

## 2019-06-21 ENCOUNTER — Encounter (HOSPITAL_COMMUNITY): Payer: Self-pay

## 2019-06-21 ENCOUNTER — Ambulatory Visit (HOSPITAL_COMMUNITY)
Admission: EM | Admit: 2019-06-21 | Discharge: 2019-06-21 | Disposition: A | Discharge status: Home / Self Care | Payer: HRSA Program | Attending: Emergency Medicine | Admitting: Emergency Medicine

## 2019-06-21 ENCOUNTER — Other Ambulatory Visit: Payer: Self-pay

## 2019-06-21 ENCOUNTER — Ambulatory Visit (INDEPENDENT_AMBULATORY_CARE_PROVIDER_SITE_OTHER): Payer: HRSA Program

## 2019-06-21 DIAGNOSIS — Z20828 Contact with and (suspected) exposure to other viral communicable diseases: Secondary | ICD-10-CM | POA: Diagnosis not present

## 2019-06-21 DIAGNOSIS — Z20822 Contact with and (suspected) exposure to covid-19: Secondary | ICD-10-CM

## 2019-06-21 DIAGNOSIS — R05 Cough: Secondary | ICD-10-CM | POA: Diagnosis not present

## 2019-06-21 DIAGNOSIS — R059 Cough, unspecified: Secondary | ICD-10-CM

## 2019-06-21 MED ORDER — BENZONATATE 100 MG PO CAPS: 100.0000 mg | ORAL_CAPSULE | Freq: Three times a day (TID) | ORAL | 0 refills | Status: DC

## 2019-06-21 NOTE — ED Provider Notes (Signed)
[facility]    CSN: [id number] Arrival date & time: 06/21/19  1004      History   Chief Complaint Chief Complaint  Patient presents with  . Cough  . Diarrhea    HPI [name] is a 33 y.o. male.   Patient presents with a 4-day history of fatigue, cough, sneezing, congestion, diarrhea.  He denies fever, chills, sore throat, shortness of breath, vomiting, rash, or other symptoms.  His medical history is significant for hypertension, chronic back pain, fatigue, marijuana user, and current smoker.  The history is provided by the patient.    Past Medical History:  Diagnosis Date  . Hypertension     Patient Active Problem List   Diagnosis Date Noted  . 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 Chronic back pain 05/05/2019  . Fatigue 05/05/2019    History reviewed. No pertinent surgical history.     Home Medications    Prior to Admission medications   Medication Sig Start Date End Date Taking? Authorizing Provider  alum & mag hydroxide-simeth (MAALOX/MYLANTA) 200-200-20 MG/5ML suspension Take 15 mLs by mouth every 6 (six) hours as needed for indigestion or heartburn. 04/14/19   Wieters, Hallie C, PA-C  benzonatate (TESSALON) 100 MG capsule Take 1 capsule (100 mg total) by mouth every 8 (eight) hours. 06/21/19   Mickie Bail, NP  cyclobenzaprine (FLEXERIL) 10 MG tablet Take 1 tablet (10 mg total) by mouth 2 (two) times daily as needed for muscle spasms. 06/09/19   Massie Maroon, FNP  meloxicam (MOBIC) 7.5 MG tablet Take 1 tablet (7.5 mg total) by mouth daily. 05/05/19   Massie Maroon, FNP  omeprazole (PRILOSEC) 20 MG capsule Take 1 capsule (20 mg total) by mouth daily for 14 days. 04/14/19 06/09/19  Wieters, Junius Creamer, PA-C    Family History Family History  Problem Relation Age of Onset  . Diabetes Mother   . Healthy Father     Social History Social History   Tobacco Use  . Smoking status: Current Every Day Smoker  . Smokeless tobacco: Never Used   Substance Use Topics  . Alcohol use: Yes  . Drug use: Yes    Frequency: 3.0 times per week    Types: Marijuana     Allergies   Patient has no known allergies.   Review of Systems Review of Systems  Constitutional: Positive for fatigue. Negative for chills and fever.  HENT: Positive for congestion and sneezing. Negative for ear pain and sore throat.   Eyes: Negative for pain and visual disturbance.  Respiratory: Positive for cough. Negative for shortness of breath.   Cardiovascular: Negative for chest pain and palpitations.  Gastrointestinal: Positive for diarrhea. Negative for abdominal pain, nausea and vomiting.  Genitourinary: Negative for dysuria and hematuria.  Musculoskeletal: Negative for arthralgias and back pain.  Skin: Negative for color change and rash.  Neurological: Negative for seizures and syncope.  All other systems reviewed and are negative.    Physical Exam Triage Vital Signs ED Triage Vitals  Enc Vitals Group     BP      Pulse      Resp      Temp      Temp src      SpO2      Weight      Height      Head Circumference      Peak Flow      Pain Score      Pain Loc  Pain Edu?      Excl. in [place]?    No data found.  Updated Vital Signs BP 133/89 (BP Location: Right Arm)   Pulse 75   Temp 98.5 F (36.9 C) (Oral)   Resp 16   SpO2 98%   Visual Acuity Right Eye Distance:   Left Eye Distance:   Bilateral Distance:    Right Eye Near:   Left Eye Near:    Bilateral Near:     Physical Exam Vitals signs and nursing note reviewed.  Constitutional:      Appearance: He is well-developed.  HENT:     Head: Normocephalic and atraumatic.     Right Ear: Tympanic membrane normal.     Left Ear: Tympanic membrane normal.     Nose: Nose normal.     Mouth/Throat:     Mouth: Mucous membranes are moist.     Pharynx: Oropharynx is clear.  Eyes:     Conjunctiva/sclera: Conjunctivae normal.  Neck:     Musculoskeletal: Neck supple.  Cardiovascular:      Rate and Rhythm: Normal rate and regular rhythm.     Heart sounds: No murmur.  Pulmonary:     Effort: Pulmonary effort is normal. No respiratory distress.     Breath sounds: Rhonchi present. No wheezing.  Abdominal:     General: Bowel sounds are normal.     Palpations: Abdomen is soft.     Tenderness: There is no abdominal tenderness. There is no guarding or rebound.  Skin:    General: Skin is warm and dry.     Findings: No rash.  Neurological:     General: No focal deficit present.     Mental Status: He is alert and oriented to person, place, and time.      UC Treatments / Results  Labs (all labs ordered are listed, but only abnormal results are displayed) Labs Reviewed  NOVEL CORONAVIRUS, NAA (HOSP ORDER, SEND-OUT TO REF LAB; TAT 18-24 HRS)    EKG   Radiology Dg Chest 2 View  Result Date: 06/21/2019 CLINICAL DATA:  Per pt: sick since Thursday, cough, fever, sputum is yellow, chest pain mid to superior sternum. No history of respiratory or cardiac disease. Smoker of marijuana, stated " a lot". No HBP, no diabetes. Tested negative in July/2020 for covid cough EXAM: CHEST - 2 VIEW COMPARISON:  None. FINDINGS: Normal mediastinum and cardiac silhouette. Normal pulmonary vasculature. No evidence of effusion, infiltrate, or pneumothorax. No acute bony abnormality. IMPRESSION: Normal chest radiograph Electronically Signed   By: Genevive BiStewart  Edmunds M.D.   On: 06/21/2019 11:49    Procedures Procedures (including critical care time)  Medications Ordered in UC Medications - No data to display  Initial Impression / Assessment and Plan / UC Course  I have reviewed the triage vital signs and the nursing notes.  Pertinent labs & imaging results that were available during my care of the patient were reviewed by me and considered in my medical decision making (see chart for details).   Cough, suspected COVID.  CXR negative.  Treating with Tessalon Perles.  COVID test performed here.   Instructed patient to self quarantine until his test results are back.  Instructed patient to go to the emergency department if he develops high fever, shortness of breath, severe diarrhea, or other concerning symptoms.  Patient agrees with plan of care.     Final Clinical Impressions(s) / UC Diagnoses   Final diagnoses:  Cough  Suspected COVID-19 virus infection  Discharge Instructions     Take the prescribed cough medication as needed.    Your COVID test is pending.  You should self quarantine until your test result is back and is negative.    Go to the emergency department if you develop high fever, shortness of breath, severe diarrhea, or other concerning symptoms.       ED Prescriptions    Medication Sig Dispense Auth. Provider   benzonatate (TESSALON) 100 MG capsule Take 1 capsule (100 mg total) by mouth every 8 (eight) hours. 21 capsule Sharion Balloon, NP     PDMP not reviewed this encounter.   Sharion Balloon, NP 06/21/19 1213

## 2019-06-21 NOTE — Discharge Instructions (Addendum)
Take the prescribed cough medication as needed.    Your COVID test is pending.  You should self quarantine until your test result is back and is negative.    Go to the emergency department if you develop high fever, shortness of breath, severe diarrhea, or other concerning symptoms.

## 2019-06-21 NOTE — ED Triage Notes (Signed)
Pt present coughing, sneezing and diarrhea. Symptoms started on Friday. Pt tired otc medication with no relief.

## 2019-06-22 LAB — NOVEL CORONAVIRUS, NAA (HOSP ORDER, SEND-OUT TO REF LAB; TAT 18-24 HRS): SARS-CoV-2, NAA: NOT DETECTED

## 2019-07-01 ENCOUNTER — Other Ambulatory Visit: Payer: Self-pay

## 2019-07-01 DIAGNOSIS — Z20822 Contact with and (suspected) exposure to covid-19: Secondary | ICD-10-CM

## 2019-07-02 LAB — NOVEL CORONAVIRUS, NAA: SARS-CoV-2, NAA: NOT DETECTED

## 2019-10-30 ENCOUNTER — Encounter (HOSPITAL_COMMUNITY): Payer: Self-pay

## 2019-10-30 ENCOUNTER — Ambulatory Visit (HOSPITAL_COMMUNITY)
Admission: EM | Admit: 2019-10-30 | Discharge: 2019-10-30 | Disposition: A | Discharge status: Home / Self Care | Payer: HRSA Program | Attending: Emergency Medicine | Admitting: Emergency Medicine

## 2019-10-30 ENCOUNTER — Other Ambulatory Visit: Payer: Self-pay

## 2019-10-30 DIAGNOSIS — Z20822 Contact with and (suspected) exposure to covid-19: Secondary | ICD-10-CM | POA: Insufficient documentation

## 2019-10-30 DIAGNOSIS — I10 Essential (primary) hypertension: Secondary | ICD-10-CM | POA: Insufficient documentation

## 2019-10-30 DIAGNOSIS — X58XXXA Exposure to other specified factors, initial encounter: Secondary | ICD-10-CM | POA: Insufficient documentation

## 2019-10-30 DIAGNOSIS — K0889 Other specified disorders of teeth and supporting structures: Secondary | ICD-10-CM

## 2019-10-30 DIAGNOSIS — S025XXA Fracture of tooth (traumatic), initial encounter for closed fracture: Secondary | ICD-10-CM | POA: Diagnosis not present

## 2019-10-30 DIAGNOSIS — Z833 Family history of diabetes mellitus: Secondary | ICD-10-CM | POA: Diagnosis not present

## 2019-10-30 DIAGNOSIS — F17200 Nicotine dependence, unspecified, uncomplicated: Secondary | ICD-10-CM | POA: Diagnosis not present

## 2019-10-30 MED ORDER — DOXYCYCLINE HYCLATE 100 MG PO CAPS: 100.0000 mg | ORAL_CAPSULE | Freq: Two times a day (BID) | ORAL | 0 refills | Status: DC

## 2019-10-30 MED ORDER — IBUPROFEN 800 MG PO TABS: 800.0000 mg | ORAL_TABLET | Freq: Three times a day (TID) | ORAL | 0 refills | Status: DC | PRN

## 2019-10-30 NOTE — ED Provider Notes (Signed)
[facility]    CSN: [id number] Arrival date & time: 10/30/19  1235      History   Chief Complaint Chief Complaint  Patient presents with  . Dental Pain    HPI [name] is a 34 y.o. male.   Patient reports that he has an abscessed tooth.  Reports that he has an appointment with his dentist on Tuesday.  Reports that he spoke with dentist, and dentist recommended that he come to urgent care for antibiotic therapy until he can get in the office on Tuesday.  Reports that his right side of his face is swollen this morning.  Denies trying to treat at home.  Denies headache, body aches, shortness of breath, sore throat, ear pain, fever, rash, other symptoms.  ROS per HPI  The history is provided by the patient.  Dental Pain   Past Medical History:  Diagnosis Date  . Hypertension     Patient Active Problem List   Diagnosis Date Noted  . 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 Chronic back pain 05/05/2019  . Fatigue 05/05/2019    History reviewed. No pertinent surgical history.     Home Medications    Prior to Admission medications   Medication Sig Start Date End Date Taking? Authorizing Provider  alum & mag hydroxide-simeth (MAALOX/MYLANTA) 200-200-20 MG/5ML suspension Take 15 mLs by mouth every 6 (six) hours as needed for indigestion or heartburn. 04/14/19   Wieters, Hallie C, PA-C  benzonatate (TESSALON) 100 MG capsule Take 1 capsule (100 mg total) by mouth every 8 (eight) hours. 06/21/19   Mickie Bail, NP  cyclobenzaprine (FLEXERIL) 10 MG tablet Take 1 tablet (10 mg total) by mouth 2 (two) times daily as needed for muscle spasms. 06/09/19   Massie Maroon, FNP  doxycycline (VIBRAMYCIN) 100 MG capsule Take 1 capsule (100 mg total) by mouth 2 (two) times daily. 10/30/19   Moshe Cipro, NP  ibuprofen (ADVIL) 800 MG tablet Take 1 tablet (800 mg total) by mouth every 8 (eight) hours as needed for moderate pain. 10/30/19   Moshe Cipro, NP  meloxicam  (MOBIC) 7.5 MG tablet Take 1 tablet (7.5 mg total) by mouth daily. 05/05/19   Massie Maroon, FNP  omeprazole (PRILOSEC) 20 MG capsule Take 1 capsule (20 mg total) by mouth daily for 14 days. 04/14/19 06/09/19  Wieters, Junius Creamer, PA-C    Family History Family History  Problem Relation Age of Onset  . Diabetes Mother   . Healthy Father     Social History Social History   Tobacco Use  . Smoking status: Current Every Day Smoker  . Smokeless tobacco: Never Used  Substance Use Topics  . Alcohol use: Yes  . Drug use: Yes    Frequency: 3.0 times per week    Types: Marijuana     Allergies   Patient has no known allergies.   Review of Systems Review of Systems   Physical Exam Triage Vital Signs ED Triage Vitals  Enc Vitals Group     BP 10/30/19 1348 125/69     Pulse Rate 10/30/19 1348 75     Resp 10/30/19 1348 18     Temp 10/30/19 1348 98.5 F (36.9 C)     Temp Source 10/30/19 1348 Oral     SpO2 10/30/19 1348 98 %     Weight --      Height --      Head Circumference --      Peak Flow --  Pain Score 10/30/19 1346 10     Pain Loc --      Pain Edu? --      Excl. in [place]? --    No data found.  Updated Vital Signs BP 125/69 (BP Location: Left Arm)   Pulse 75   Temp 98.5 F (36.9 C) (Oral)   Resp 18   SpO2 98%   Visual Acuity Right Eye Distance:   Left Eye Distance:   Bilateral Distance:    Right Eye Near:   Left Eye Near:    Bilateral Near:     Physical Exam Vitals and nursing note reviewed.  Constitutional:      Appearance: Normal appearance. He is well-developed.  HENT:     Head: Normocephalic and atraumatic.     Nose: Nose normal.     Mouth/Throat:     Dentition: Abnormal dentition. Gingival swelling, dental caries and dental abscesses present.   Eyes:     Conjunctiva/sclera: Conjunctivae normal.  Cardiovascular:     Rate and Rhythm: Normal rate and regular rhythm.     Heart sounds: No murmur.  Pulmonary:     Effort: Pulmonary effort is  normal. No respiratory distress.     Breath sounds: Normal breath sounds.  Abdominal:     General: Bowel sounds are normal. There is no distension.     Palpations: Abdomen is soft. There is no mass.     Tenderness: There is no abdominal tenderness. There is no guarding or rebound.     Hernia: No hernia is present.  Musculoskeletal:        General: Normal range of motion.     Cervical back: Neck supple.  Skin:    General: Skin is warm and dry.     Capillary Refill: Capillary refill takes less than 2 seconds.  Neurological:     Mental Status: He is alert and oriented to person, place, and time.  Psychiatric:        Mood and Affect: Mood normal.        Behavior: Behavior normal.      UC Treatments / Results  Labs (all labs ordered are listed, but only abnormal results are displayed) Labs Reviewed  NOVEL CORONAVIRUS, NAA (HOSP ORDER, SEND-OUT TO REF LAB; TAT 18-24 HRS)    EKG   Radiology No results found.  Procedures Procedures (including critical care time)  Medications Ordered in UC Medications - No data to display  Initial Impression / Assessment and Plan / UC Course  I have reviewed the triage vital signs and the nursing notes.  Pertinent labs & imaging results that were available during my care of the patient were reviewed by me and considered in my medical decision making (see chart for details).     Dental abscess in right upper mouth.  Multiple broken teeth, gingivitis, dental caries present.  Prescribe doxycycline 100 mg twice daily x7 days.  Instructed to follow-up with his dentist.  Instructed on when to go to the ER. Final Clinical Impressions(s) / UC Diagnoses   Final diagnoses:  Pain, dental  Closed fracture of tooth, initial encounter     Discharge Instructions     You have an infection in your mouth. I have sent antibiotics and ibuprofen to your pharmacy.  Follow up with your dentist.     ED Prescriptions    Medication Sig Dispense Auth.  Provider   doxycycline (VIBRAMYCIN) 100 MG capsule Take 1 capsule (100 mg total) by mouth 2 (two) times daily.  14 capsule Moshe Cipro, NP   ibuprofen (ADVIL) 800 MG tablet Take 1 tablet (800 mg total) by mouth every 8 (eight) hours as needed for moderate pain. 21 tablet Moshe Cipro, NP     I have reviewed the PDMP during this encounter.   Moshe Cipro, NP 10/30/19 1749

## 2019-10-30 NOTE — ED Triage Notes (Signed)
Pt presents to UC with dental pain x 2 days, right-sided face swelling x 2 days. Pt reports he had diarrhea yesterday. Pt states his Dentist suggested to come here to be evaluated and have antibiotic.

## 2019-10-30 NOTE — Discharge Instructions (Addendum)
You have an infection in your mouth. I have sent antibiotics and ibuprofen to your pharmacy.  Follow up with your dentist.

## 2019-11-02 LAB — NOVEL CORONAVIRUS, NAA (HOSP ORDER, SEND-OUT TO REF LAB; TAT 18-24 HRS): SARS-CoV-2, NAA: NOT DETECTED

## 2019-11-18 ENCOUNTER — Telehealth: Payer: Self-pay | Admitting: Family Medicine

## 2019-11-18 NOTE — Telephone Encounter (Signed)
Pt was called several times to reschedule his appointment no option for voicemail and Pt never answered phone

## 2019-12-07 ENCOUNTER — Telehealth: Payer: Self-pay | Admitting: Family Medicine

## 2019-12-07 NOTE — Telephone Encounter (Signed)
Call several times no answer or voicemail tried to rescheduled pt

## 2019-12-08 ENCOUNTER — Ambulatory Visit: Payer: Self-pay | Admitting: Family Medicine

## 2019-12-29 ENCOUNTER — Ambulatory Visit: Payer: Self-pay | Admitting: Family Medicine

## 2020-04-11 ENCOUNTER — Other Ambulatory Visit: Payer: Self-pay

## 2020-04-11 ENCOUNTER — Encounter (HOSPITAL_COMMUNITY): Payer: Self-pay

## 2020-04-11 ENCOUNTER — Ambulatory Visit (INDEPENDENT_AMBULATORY_CARE_PROVIDER_SITE_OTHER): Payer: Self-pay

## 2020-04-11 ENCOUNTER — Ambulatory Visit (HOSPITAL_COMMUNITY)
Admission: EM | Admit: 2020-04-11 | Discharge: 2020-04-11 | Disposition: A | Discharge status: Home / Self Care | Payer: Self-pay | Attending: Emergency Medicine | Admitting: Emergency Medicine

## 2020-04-11 DIAGNOSIS — M25552 Pain in left hip: Secondary | ICD-10-CM

## 2020-04-11 DIAGNOSIS — S72112A Displaced fracture of greater trochanter of left femur, initial encounter for closed fracture: Secondary | ICD-10-CM

## 2020-04-11 MED ORDER — HYDROCODONE-ACETAMINOPHEN 5-325 MG PO TABS: 1.0000 | ORAL_TABLET | Freq: Four times a day (QID) | ORAL | 0 refills | Status: DC | PRN

## 2020-04-11 MED ORDER — IBUPROFEN 800 MG PO TABS
800.0000 mg | ORAL_TABLET | Freq: Three times a day (TID) | ORAL | 0 refills | Status: DC
Start: 2020-04-11 — End: 2022-02-27

## 2020-04-11 NOTE — Discharge Instructions (Signed)
Fracture of part of hip Use anti-inflammatories for pain/swelling. You may take up to 800 mg Ibuprofen every 8 hours with food. You may supplement Ibuprofen with Tylenol [phone] mg every 8 hours.  Hydrocodone for severe pain Follow up with Dr. Eulah Pont later this week
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
## 2020-06-16 ENCOUNTER — Ambulatory Visit (HOSPITAL_COMMUNITY)
Admission: EM | Admit: 2020-06-16 | Discharge: 2020-06-16 | Disposition: A | Discharge status: Home / Self Care | Payer: Self-pay | Attending: Internal Medicine | Admitting: Internal Medicine

## 2020-06-16 ENCOUNTER — Encounter (HOSPITAL_COMMUNITY): Payer: Self-pay | Admitting: *Deleted

## 2020-06-16 ENCOUNTER — Other Ambulatory Visit: Payer: Self-pay

## 2020-06-16 DIAGNOSIS — Z20822 Contact with and (suspected) exposure to covid-19: Secondary | ICD-10-CM | POA: Insufficient documentation

## 2020-06-16 DIAGNOSIS — R5383 Other fatigue: Secondary | ICD-10-CM | POA: Insufficient documentation

## 2020-06-16 LAB — SARS CORONAVIRUS 2 (TAT 6-24 HRS): SARS Coronavirus 2: POSITIVE — AB

## 2020-06-16 NOTE — ED Triage Notes (Signed)
Patient in with complaints of fever, SOB, mid chest pain, no energy, loss of appetite and diarrhea x 2 days. Patient has taken Mucinex and Tylenol PM. Patient has received 1st dose of COVID vaccine.

## 2020-06-16 NOTE — Discharge Instructions (Addendum)
Thank you for coming in to see [place] today! Please see below to review our plan for today's visit:  1.  Your Covid test should result by tomorrow morning.  Please look out on your MyChart account for the results of your Covid test. 2.  Even if your Covid test is negative, this could be a false positive result.  I would still encourage you to quarantine for the next 10 days until the majority of her symptoms (shortness of breath, chills, body aches, etc.) 3.  Please seek emergency medical attention should you develop significant shortness of breath or be unable to stay hydrated. 4.  As soon as you are feeling better (after 10 days) I strongly encourage you to obtain the second dose of your Covid vaccine series, as well as your flu vaccine.  It was our pleasure to serve you!   Dr. Peggyann Shoals [facility]

## 2020-06-16 NOTE — ED Provider Notes (Addendum)
[facility]    CSN: [id number] Arrival date & time: 06/16/20  0847      History   Chief Complaint Chief Complaint  Patient presents with  . Cough  . Diarrhea  . Shortness of Breath  . Chest Pain    HPI [name] is a 34 y.o. male presenting to urgent care clinic with concerns for feeling under the weather starting Monday 10/4 and feeling worse 10/5.  He reports having shortness of breath, chest tightness, cold sweats, chills, body aches, weakness, decreased appetite, decreased sense of smell and taste, and diarrhea. He denies any vomiting ear pain, cough, sore throat, sick contacts, or recent travel.  He has been using Mucinex, NyQuil, and DayQuil various times of day to reduce his symptoms.  He is not sure if he has had fevers, temperature today normal at 98.7.  Patient satting 96% on room air with relatively comfortable work of breathing.  Patient is partially vaccinated against COVID, had his first dose at [facility] on [address] about 1 month ago.  No significant past medical history, reports occasional alcohol use, denies cigarette smoking, does smoke marijuana daily.  HPI  History reviewed. No pertinent past medical history.  Patient Active Problem List   Diagnosis Date Noted  . 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 Chronic back pain 05/05/2019  . Fatigue 05/05/2019    History reviewed. No pertinent surgical history.   Home Medications    Prior to Admission medications   Medication Sig Start Date End Date Taking? Authorizing Provider  alum & mag hydroxide-simeth (MAALOX/MYLANTA) 200-200-20 MG/5ML suspension Take 15 mLs by mouth every 6 (six) hours as needed for indigestion or heartburn. 04/14/19   Wieters, Hallie C, PA-C  benzonatate (TESSALON) 100 MG capsule Take 1 capsule (100 mg total) by mouth every 8 (eight) hours. 06/21/19   Mickie Bail, NP  cyclobenzaprine (FLEXERIL) 10 MG tablet Take 1 tablet (10 mg total) by mouth 2 (two) times daily as  needed for muscle spasms. 06/09/19   Massie Maroon, FNP  HYDROcodone-acetaminophen (NORCO/VICODIN) 5-325 MG tablet Take 1-2 tablets by mouth every 6 (six) hours as needed for severe pain. 04/11/20   Wieters, Hallie C, PA-C  ibuprofen (ADVIL) 800 MG tablet Take 1 tablet (800 mg total) by mouth 3 (three) times daily. 04/11/20   Wieters, Hallie C, PA-C  meloxicam (MOBIC) 7.5 MG tablet Take 1 tablet (7.5 mg total) by mouth daily. 05/05/19   Massie Maroon, FNP  omeprazole (PRILOSEC) 20 MG capsule Take 1 capsule (20 mg total) by mouth daily for 14 days. 04/14/19 06/09/19  Wieters, Junius Creamer, PA-C    Family History Family History  Problem Relation Age of Onset  . Diabetes Mother   . Healthy Father     Social History Social History   Tobacco Use  . Smoking status: Former Games developer  . Smokeless tobacco: Never Used  . Tobacco comment: quit 8-9 years ago  Vaping Use  . Vaping Use: Never used  Substance Use Topics  . Alcohol use: Yes    Comment: occ  . Drug use: Yes    Types: Marijuana    Comment: daily     Allergies   Garlic   Review of Systems Review of Systems -see HPI   Physical Exam Triage Vital Signs ED Triage Vitals  Enc Vitals Group     BP 06/16/20 0913 134/82     Pulse Rate 06/16/20 0913 60     Resp 06/16/20 0913 16  Temp 06/16/20 0913 98.7 F (37.1 C)     Temp Source 06/16/20 0913 Oral     SpO2 06/16/20 0913 96 %     Weight --      Height --      Head Circumference --      Peak Flow --      Pain Score 06/16/20 0910 0     Pain Loc --      Pain Edu? --      Excl. in GC? --    No data found.  Updated Vital Signs BP 134/82 (BP Location: Right Arm)   Pulse 60   Temp 98.7 F (37.1 C) (Oral)   Resp 16   SpO2 96%   Physical exam: General: Nontoxic-appearing, in mild distress secondary to discomfort HEENT: Normocephalic, atraumatic, EOMI, PERRLA, mild erythema and edema to bilateral nasal turbinates, no pharyngeal erythema or tonsillar exudates Neck: Mild  anterior cervical lymphadenopathy Respiratory: Comfortable work of breathing on room air, speaking complete sentences, CTA bilaterally however breath sounds somewhat distant, no wheezes auscultated Cardio: RRR, S1-S2 present, no murmurs appreciated Integumentary: No rashes   UC Treatments / Results  Labs (all labs ordered are listed, but only abnormal results are displayed) Labs Reviewed  SARS CORONAVIRUS 2 (TAT 6-24 HRS)    Medications Ordered in UC Medications - No data to display  Initial Impression / Assessment and Plan / UC Course  I have reviewed the triage vital signs and the nursing notes.  Pertinent labs & imaging results that were available during my care of the patient were reviewed by me and considered in my medical decision making (see chart for details).   Encounter for COVID Test: Patient with signs and symptoms concerning for Covid.  No known sick contacts. -Patient being tested for Covid -Instructed on symptomatic relief and supportive measures at home with over-the-counter meds -Patient instructed to quarantine for at least the next 10 days if his Covid test is positive -Instructed to receive second dose of COVID vaccine as well as flu vaccine as soon as he is healthy and able -Strict return precautions provided   Final Clinical Impressions(s) / UC Diagnoses   Final diagnoses:  Encounter for screening laboratory testing for COVID-19 virus   Discharge Instructions   None    ED Prescriptions    None     PDMP not reviewed this encounter.    Peggyann Shoals, DO [facility], PGY-3 06/16/2020 9:23 AM    Dollene Cleveland, DO 06/16/20 0955    Dollene Cleveland, DO 06/16/20 5643

## 2020-06-17 ENCOUNTER — Telehealth: Payer: Self-pay | Admitting: Family

## 2020-06-17 ENCOUNTER — Telehealth: Payer: Self-pay

## 2020-06-17 NOTE — Telephone Encounter (Signed)
Pt. Calling to verify positive COVID 19 test. Result is positive.Pt. verbalizes understanding.Pt notified of positive COVID-19 test results. Pt verbalized understanding. Pt reports that they are feeling better.Pt advised to remain in self quarantine until at least 10 days since symptom onset And at least 24 hours fever free without antipyretics And improvement in respiratory symptoms. Patient advised to utilize over the counter medications to treat symptoms. Pt advised to seek treatment in the ED if respiratory issues/distress develops.Pt advised they should only leave home to seek and medical care and must wear a mask in public. Pt instructed to limit contact with family members or caregivers in the home. Pt advised to practice social distancing and to continue to use good preventative care measures such has frequent hand washing, staying out of crowds and cleaning hard surfaces frequently touched in the home.Pt informed that the health department will likely follow up and may have additional recommendations. Will notify [facility] Department.

## 2020-06-17 NOTE — Telephone Encounter (Signed)
Called to Discuss with patient about Covid symptoms and the use of the monoclonal antibody infusion for those with mild to moderate Covid symptoms and at a high risk of hospitalization.     Pt appears to qualify for this infusion due to co-morbid conditions and/or a member of an at-risk group in accordance with the FDA Emergency Use Authorization.   [name] was seen in the [facility] urgent care center on 10/7 with cough, diarrhea, shortness of breath, and chest pain with symptom onset of 06/13/2020.  Qualifying risk factors include high social vulnerability risk.  I was unable to get in touch with [name]. No voicemail was able to be left. A MyChart message and text message were sent with additional information.  Marcos Eke, NP 06/17/2020 10:07 AM

## 2020-06-18 ENCOUNTER — Other Ambulatory Visit (HOSPITAL_COMMUNITY): Payer: Self-pay | Admitting: Nurse Practitioner

## 2020-06-18 ENCOUNTER — Ambulatory Visit (HOSPITAL_COMMUNITY): Payer: Self-pay | Attending: Pulmonary Disease

## 2020-06-18 DIAGNOSIS — U071 COVID-19: Secondary | ICD-10-CM

## 2020-06-18 NOTE — Progress Notes (Signed)
I connected by phone with [name] on 06/18/2020 at 8:59 AM to discuss the potential use of an new treatment for mild to moderate COVID-19 viral infection in non-hospitalized patients.  This patient is a 34 y.o. male that meets the FDA criteria for Emergency Use Authorization of casirivimab\imdevimab.  Has a (+) direct SARS-CoV-2 viral test result  Has mild or moderate COVID-19   Is ? 32 years of age and weighs ? 40 kg  Is NOT hospitalized due to COVID-19  Is NOT requiring oxygen therapy or requiring an increase in baseline oxygen flow rate due to COVID-19  Is within 10 days of symptom onset  Has at least one of the high risk factor(s) for progression to severe COVID-19 and/or hospitalization as defined in EUA.  Specific high risk criteria : Other high risk medical condition per CDC:  Social Vulnerability   Onset 06/13/20. Results in Epic.    I have spoken and communicated the following to the patient or parent/caregiver:  1. FDA has authorized the emergency use of casirivimab\imdevimab for the treatment of mild to moderate COVID-19 in adults and pediatric patients with positive results of direct SARS-CoV-2 viral testing who are 68 years of age and older weighing at least 40 kg, and who are at high risk for progressing to severe COVID-19 and/or hospitalization.  2. The significant known and potential risks and benefits of casirivimab\imdevimab, and the extent to which such potential risks and benefits are unknown.  3. Information on available alternative treatments and the risks and benefits of those alternatives, including clinical trials.  4. Patients treated with casirivimab\imdevimab should continue to self-isolate and use infection control measures (e.g., wear mask, isolate, social distance, avoid sharing personal items, clean and disinfect "high touch" surfaces, and frequent handwashing) according to CDC guidelines.   5. The patient or parent/caregiver has the option to  accept or refuse casirivimab\imdevimab .  After reviewing this information with the patient, the patient has agreed to receive one of the available covid 19 monoclonal antibodies and will be provided an appropriate fact sheet prior to infusion.Consuello Masse, DNP, AGNP-C [phone] (Infusion Center Hotline)

## 2020-07-01 ENCOUNTER — Encounter (HOSPITAL_COMMUNITY): Payer: Self-pay | Admitting: Emergency Medicine

## 2020-07-01 ENCOUNTER — Other Ambulatory Visit: Payer: Self-pay

## 2020-07-01 ENCOUNTER — Ambulatory Visit (HOSPITAL_COMMUNITY)
Admission: EM | Admit: 2020-07-01 | Discharge: 2020-07-01 | Disposition: A | Discharge status: Home / Self Care | Payer: Self-pay | Attending: Urgent Care | Admitting: Urgent Care

## 2020-07-01 DIAGNOSIS — K047 Periapical abscess without sinus: Secondary | ICD-10-CM

## 2020-07-01 DIAGNOSIS — K029 Dental caries, unspecified: Secondary | ICD-10-CM

## 2020-07-01 MED ORDER — AMOXICILLIN-POT CLAVULANATE 875-125 MG PO TABS: 1.0000 | ORAL_TABLET | Freq: Two times a day (BID) | ORAL | 0 refills | Status: DC

## 2020-07-01 MED ORDER — NAPROXEN 500 MG PO TABS
500.0000 mg | ORAL_TABLET | Freq: Two times a day (BID) | ORAL | 0 refills | Status: DC
Start: 2020-07-01 — End: 2022-02-27

## 2020-07-01 NOTE — ED Provider Notes (Signed)
[facility]   MRN: [id number] DOB: 05/25/1986  Subjective:   [name] is a 34 y.o. male presenting for 1 day hx of acute onset right sided dental pain, infection and facial swelling. Patient has a dentist, made an appt for 07/19/2020. Was diagnosed with COVID 19 ~2 weeks ago. Denies fever, chest pain, shob.   No current facility-administered medications for this encounter.  Current Outpatient Medications:  .  alum & mag hydroxide-simeth (MAALOX/MYLANTA) 200-200-20 MG/5ML suspension, Take 15 mLs by mouth every 6 (six) hours as needed for indigestion or heartburn., Disp: 355 mL, Rfl: 0 .  benzonatate (TESSALON) 100 MG capsule, Take 1 capsule (100 mg total) by mouth every 8 (eight) hours., Disp: 21 capsule, Rfl: 0 .  cyclobenzaprine (FLEXERIL) 10 MG tablet, Take 1 tablet (10 mg total) by mouth 2 (two) times daily as needed for muscle spasms., Disp: 20 tablet, Rfl: 0 .  HYDROcodone-acetaminophen (NORCO/VICODIN) 5-325 MG tablet, Take 1-2 tablets by mouth every 6 (six) hours as needed for severe pain., Disp: 15 tablet, Rfl: 0 .  ibuprofen (ADVIL) 800 MG tablet, Take 1 tablet (800 mg total) by mouth 3 (three) times daily., Disp: 21 tablet, Rfl: 0 .  meloxicam (MOBIC) 7.5 MG tablet, Take 1 tablet (7.5 mg total) by mouth daily., Disp: 30 tablet, Rfl: 0 .  omeprazole (PRILOSEC) 20 MG capsule, Take 1 capsule (20 mg total) by mouth daily for 14 days., Disp: 14 capsule, Rfl: 0   Allergies  Allergen Reactions  . Garlic Swelling    History reviewed. No pertinent past medical history.   History reviewed. No pertinent surgical history.  Family History  Problem Relation Age of Onset  . Diabetes Mother   . Healthy Father     Social History   Tobacco Use  . Smoking status: Former Games developer  . Smokeless tobacco: Never Used  . Tobacco comment: quit 8-9 years ago  Vaping Use  . Vaping Use: Never used  Substance Use Topics  . Alcohol use: Yes    Comment: occ  . Drug  use: Yes    Types: Marijuana    Comment: daily    ROS   Objective:   Vitals: BP (!) 110/53 (BP Location: Left Arm)   Pulse 60   Temp 98.5 F (36.9 C) (Oral)   Resp 17   SpO2 100%   Physical Exam Constitutional:      General: He is not in acute distress.    Appearance: Normal appearance. He is well-developed and normal weight. He is not ill-appearing, toxic-appearing or diaphoretic.  HENT:     Head: Normocephalic and atraumatic.      Right Ear: External ear normal.     Left Ear: External ear normal.     Nose: Nose normal.     Mouth/Throat:     Pharynx: Oropharynx is clear.   Eyes:     General: No scleral icterus.       Right eye: No discharge.        Left eye: No discharge.     Extraocular Movements: Extraocular movements intact.     Pupils: Pupils are equal, round, and reactive to light.  Cardiovascular:     Rate and Rhythm: Normal rate.  Pulmonary:     Effort: Pulmonary effort is normal.  Musculoskeletal:     Cervical back: Normal range of motion.  Neurological:     Mental Status: He is alert and oriented to person, place, and time.  Psychiatric:  Mood and Affect: Mood normal.        Behavior: Behavior normal.        Thought Content: Thought content normal.        Judgment: Judgment normal.     Assessment and Plan :   PDMP not reviewed this encounter.  1. Dental abscess   2. Pain due to dental caries     Start Augmentin for dental infection/abscess, use naproxen for pain and inflammation. Emphasized need for dental surgeon consult. Counseled patient on potential for adverse effects with medications prescribed/recommended today, strict ER and return-to-clinic precautions discussed, patient verbalized understanding.    Wallis Bamberg, [place] 07/01/20 [id number]

## 2020-07-01 NOTE — ED Triage Notes (Signed)
Pt presents with facial swelling. States has a broken tooth. Facial swelling under eye on right side xs 2 days.

## 2020-07-17 IMAGING — DX LUMBAR SPINE - COMPLETE 4+ VIEW
5 series · 5 of 5 positions shown · non-contrast
Comparison: None.

CLINICAL DATA: Lumbago

EXAM:
LUMBAR SPINE - COMPLETE 4+ VIEW

[l-spine ap]
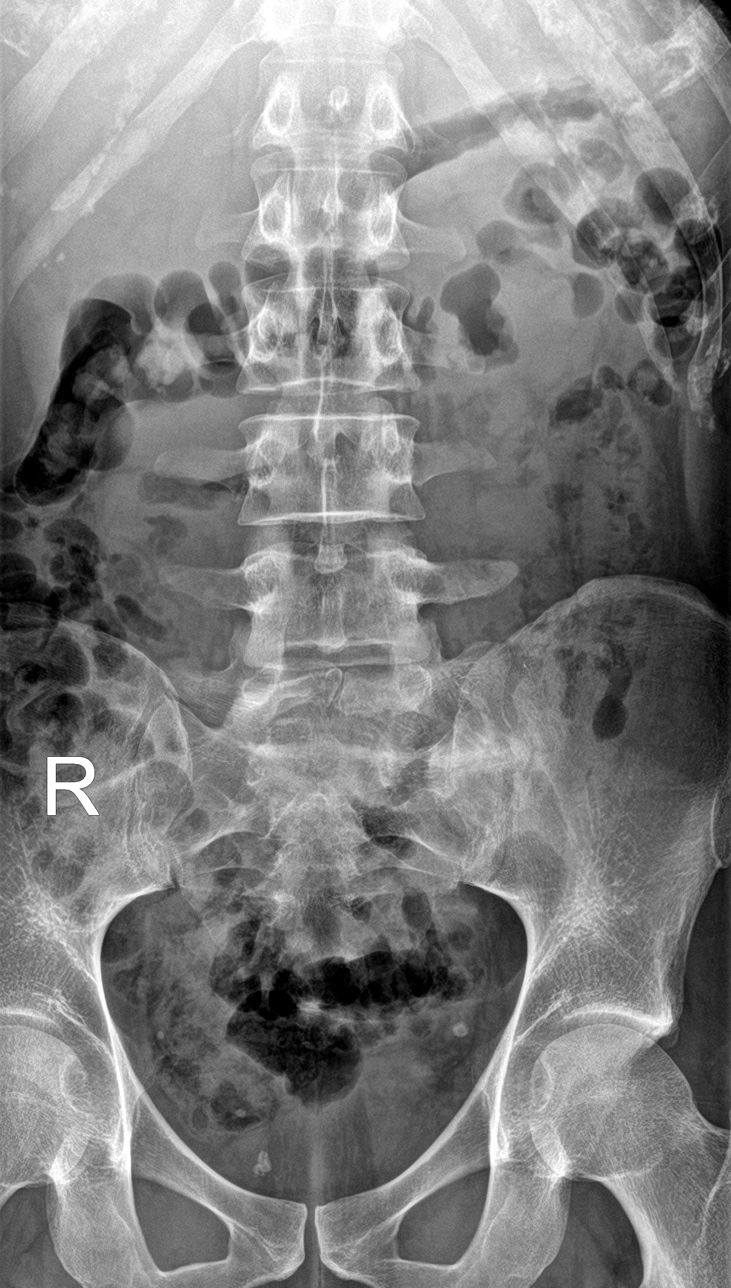

[l-spine obl (1 of 2)]
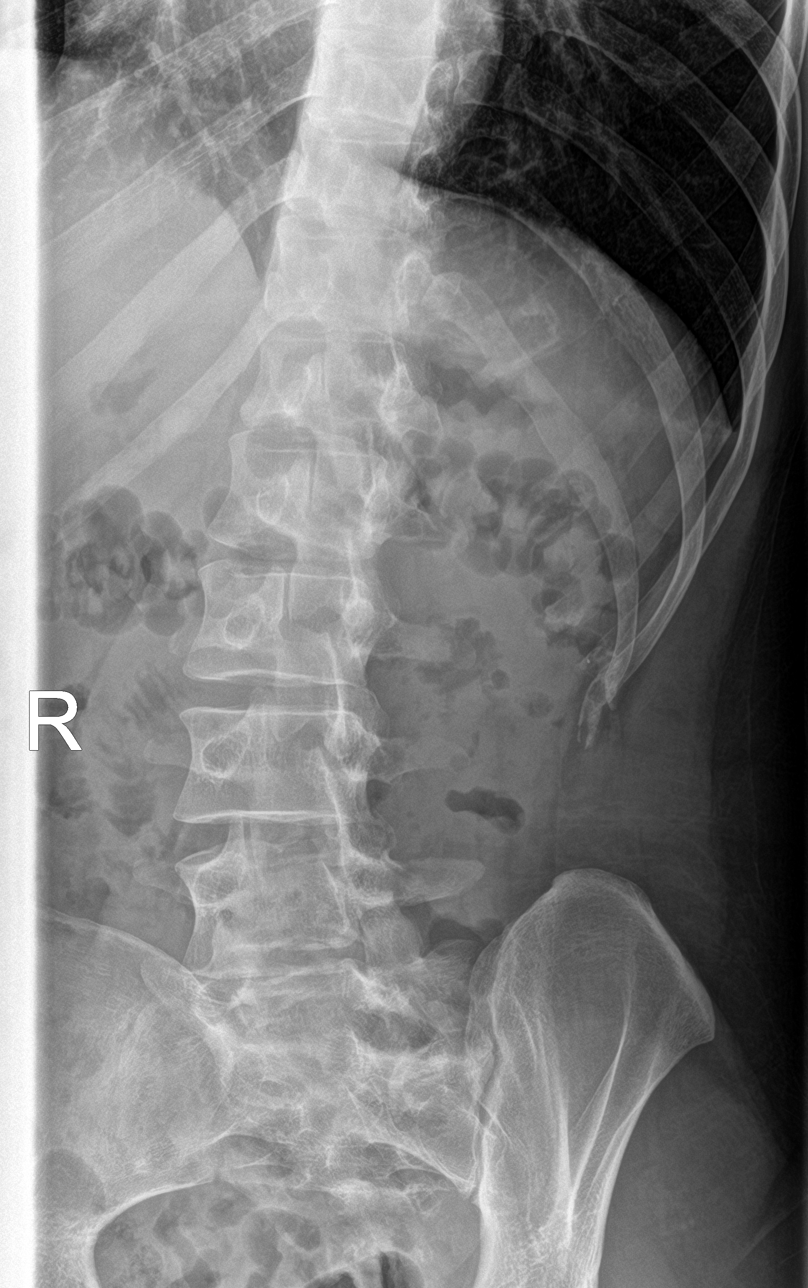

[l-spine obl (2 of 2)]
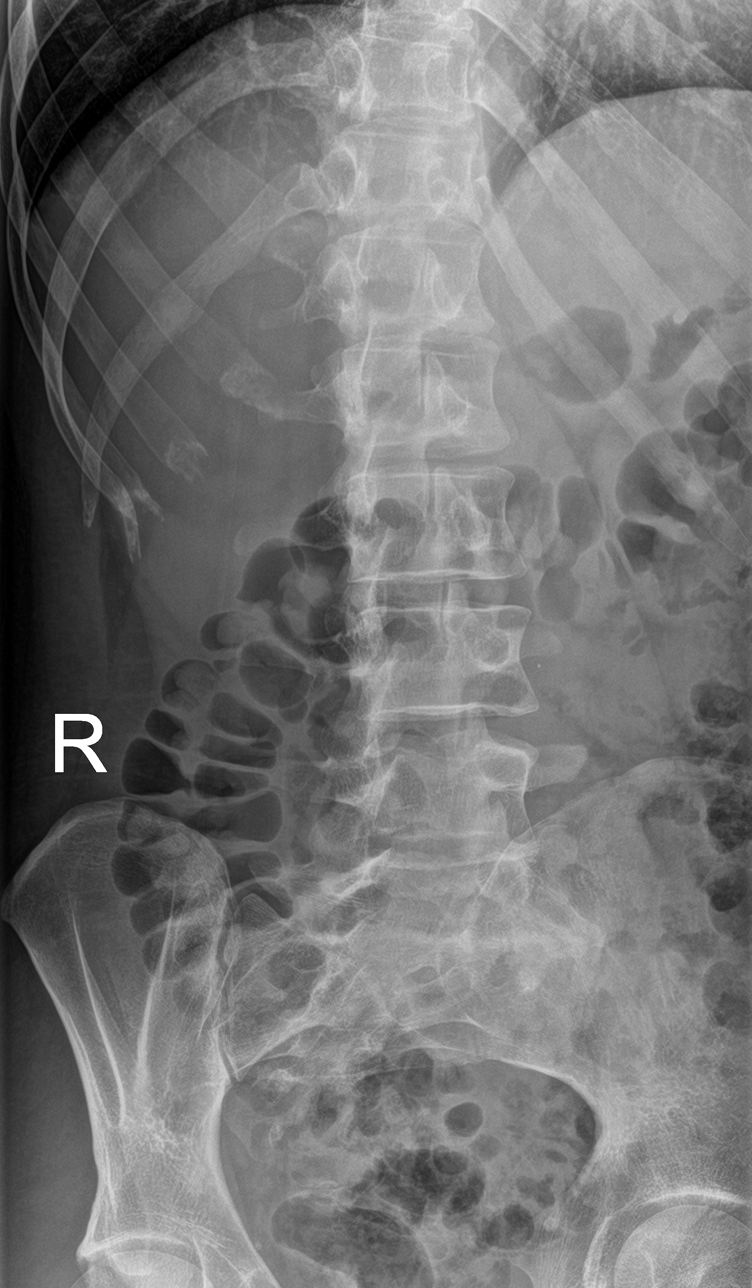

[l-spine lat]
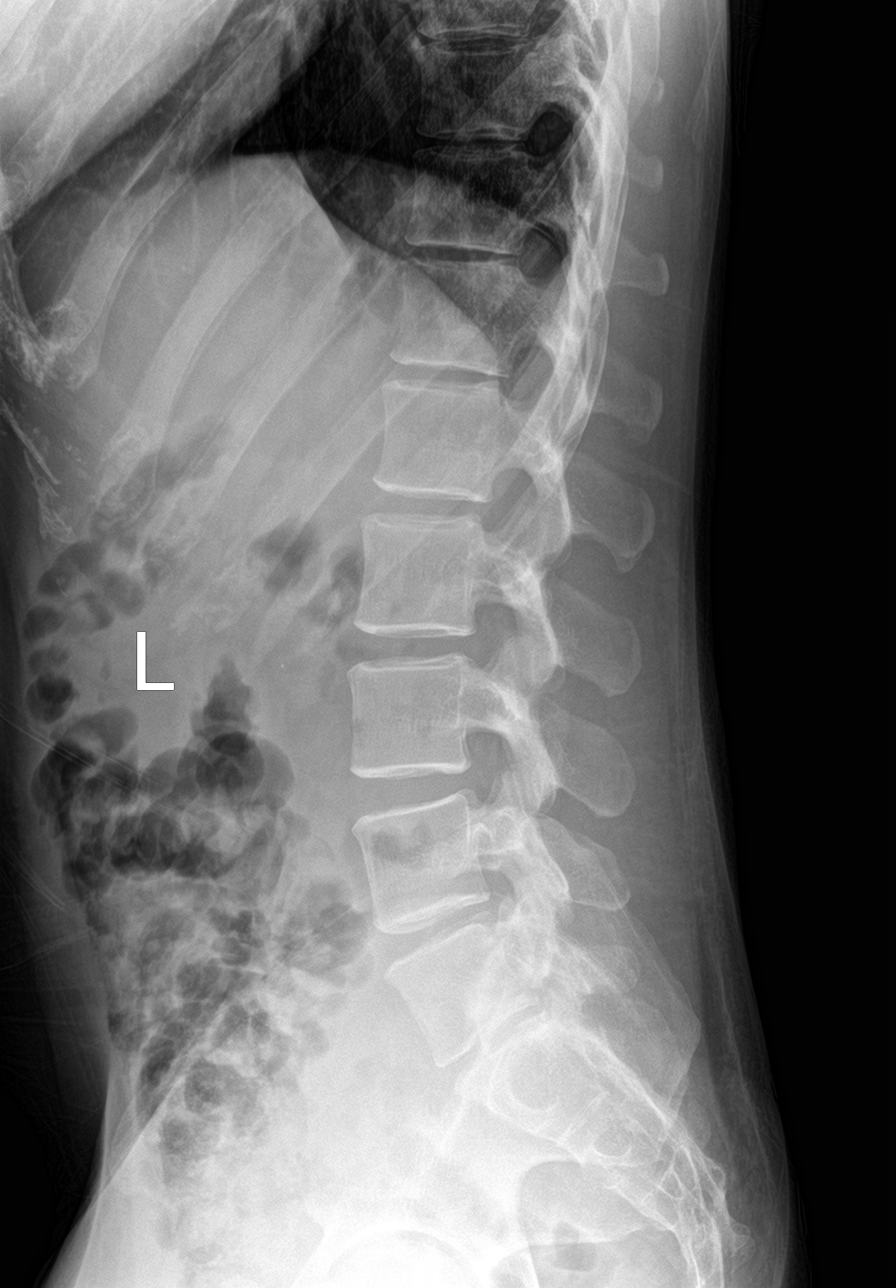

[l-spine spot]
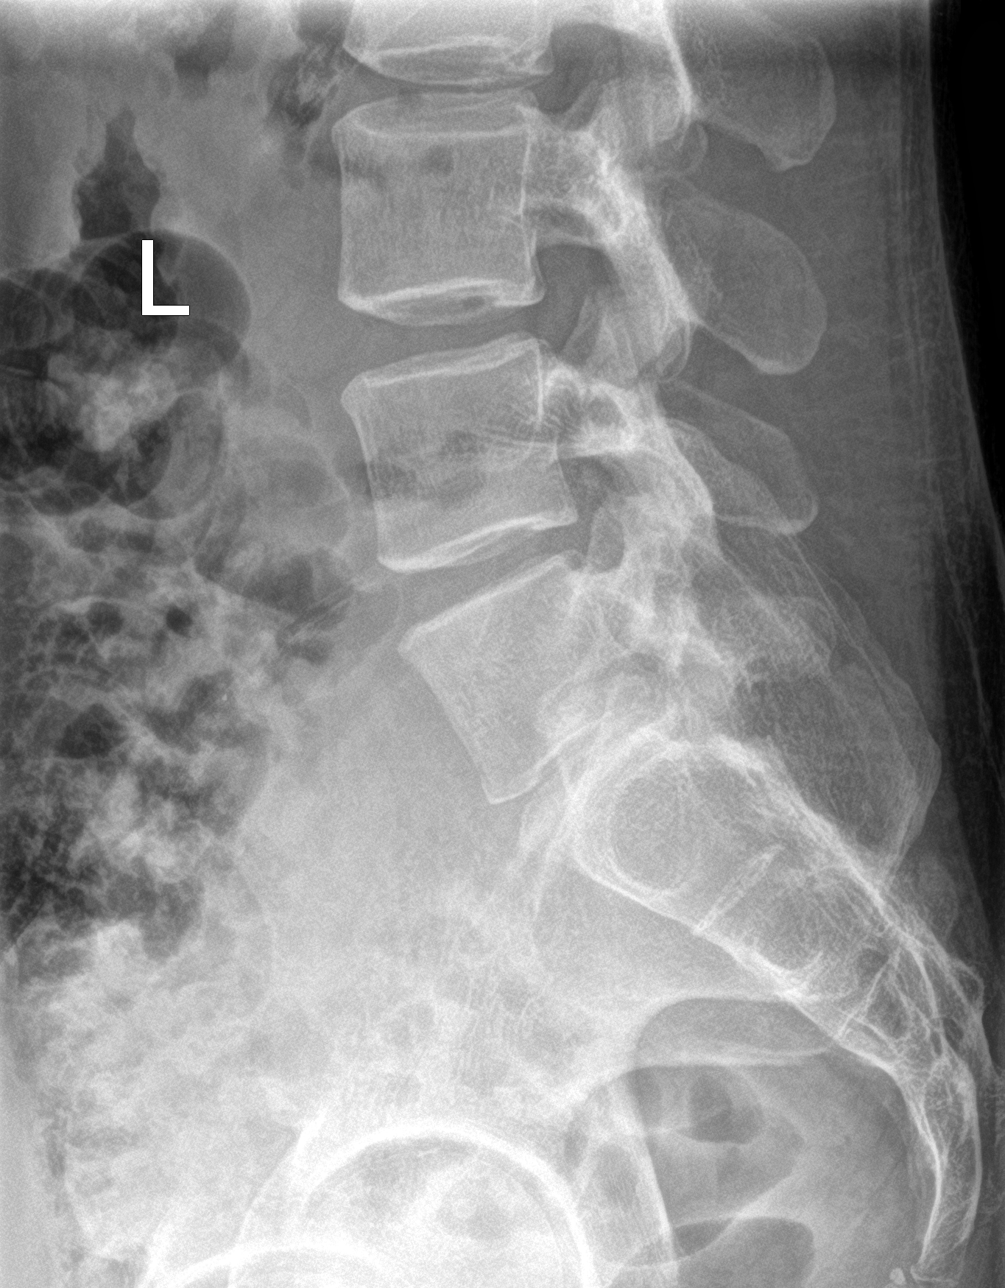

[5 of 5 positions shown; findings below may reference images not displayed]

FINDINGS: Frontal, lateral, spot lumbosacral lateral, and bilateral oblique
views were obtained. There are 5 non-rib-bearing lumbar type
vertebral bodies. There is no evident fracture or spondylolisthesis.
There is mild disc space narrowing at L5-S1. Other disc spaces
appear unremarkable. There is no appreciable facet arthropathy.
There is spina bifida occulta at S1.
IMPRESSION: Relatively mild disc space narrowing at L5-S1. Other disc spaces
appear unremarkable. No evident fracture or spondylolisthesis.

## 2021-09-13 ENCOUNTER — Other Ambulatory Visit: Payer: Self-pay

## 2021-09-13 ENCOUNTER — Ambulatory Visit: Admission: EM | Admit: 2021-09-13 | Discharge: 2021-09-13 | Discharge status: Against Medical Advice | Payer: Self-pay

## 2021-09-27 ENCOUNTER — Ambulatory Visit
Admission: EM | Admit: 2021-09-27 | Discharge: 2021-09-27 | Disposition: A | Discharge status: Home / Self Care | Payer: Self-pay | Attending: Internal Medicine | Admitting: Internal Medicine

## 2021-09-27 ENCOUNTER — Other Ambulatory Visit: Payer: Self-pay

## 2021-09-27 DIAGNOSIS — K0889 Other specified disorders of teeth and supporting structures: Secondary | ICD-10-CM

## 2021-09-27 DIAGNOSIS — K047 Periapical abscess without sinus: Secondary | ICD-10-CM

## 2021-09-27 MED ORDER — LIDOCAINE VISCOUS HCL 2 % MT SOLN: 15.0000 mL | OROMUCOSAL | 0 refills | Status: DC | PRN

## 2021-09-27 MED ORDER — AMOXICILLIN-POT CLAVULANATE 875-125 MG PO TABS: 1.0000 | ORAL_TABLET | Freq: Two times a day (BID) | ORAL | 0 refills | Status: DC

## 2021-09-27 NOTE — ED Provider Notes (Signed)
[facility]    CSN: [id number] Arrival date & time: 09/27/21  1157      History   Chief Complaint Chief Complaint  Patient presents with   broken tooth    HPI [name] is a 36 y.o. male.   Patient presents today for dental pain after a tooth was broken in the right upper mouth while eating approximately 4 to 5 days ago.  Patient reports that he has a scheduled appointment with dentist next week but pain is severe.  He has been using salt water gargles with minimal improvement.    History reviewed. No pertinent past medical history.  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 No pertinent surgical history.     Home Medications    Prior to Admission medications   Medication Sig Start Date End Date Taking? Authorizing Provider  amoxicillin-clavulanate (AUGMENTIN) 875-125 MG tablet Take 1 tablet by mouth every 12 (twelve) hours. 09/27/21  Yes Saralyn Willison, Rolly Salter E, FNP  lidocaine (XYLOCAINE) 2 % solution Use as directed 15 mLs in the mouth or throat as needed for mouth pain. Swish and spit 09/27/21  Yes Chaunce Winkels, Platea E, FNP  alum & mag hydroxide-simeth (MAALOX/MYLANTA) 200-200-20 MG/5ML suspension Take 15 mLs by mouth every 6 (six) hours as needed for indigestion or heartburn. 04/14/19   Wieters, Hallie C, PA-C  benzonatate (TESSALON) 100 MG capsule Take 1 capsule (100 mg total) by mouth every 8 (eight) hours. 06/21/19   Mickie Bail, NP  cyclobenzaprine (FLEXERIL) 10 MG tablet Take 1 tablet (10 mg total) by mouth 2 (two) times daily as needed for muscle spasms. 06/09/19   Massie Maroon, FNP  HYDROcodone-acetaminophen (NORCO/VICODIN) 5-325 MG tablet Take 1-2 tablets by mouth every 6 (six) hours as needed for severe pain. 04/11/20   Wieters, Hallie C, PA-C  ibuprofen (ADVIL) 800 MG tablet Take 1 tablet (800 mg total) by mouth 3 (three) times daily. 04/11/20    Wieters, Hallie C, PA-C  meloxicam (MOBIC) 7.5 MG tablet Take 1 tablet (7.5 mg total) by mouth daily. 05/05/19   Massie Maroon, FNP  naproxen (NAPROSYN) 500 MG tablet Take 1 tablet (500 mg total) by mouth 2 (two) times daily with a meal. 07/01/20   Wallis Bamberg, PA-C  omeprazole (PRILOSEC) 20 MG capsule Take 1 capsule (20 mg total) by mouth daily for 14 days. 04/14/19 06/09/19  Wieters, Junius Creamer, PA-C    Family History Family History  Problem Relation Age of Onset   Diabetes Mother    Healthy Father     Social History Social History   Tobacco Use   Smoking status: Former   Smokeless tobacco: Never   Tobacco comments:    quit 8-9 years ago  Vaping Use   Vaping Use: Never used  Substance Use Topics   Alcohol use: Yes    Comment: occ   Drug use: Yes    Types: Marijuana    Comment: daily     Allergies   Garlic   Review of Systems Review of Systems Per HPI  Physical Exam Triage Vital Signs ED Triage Vitals  Enc Vitals Group     BP 09/27/21 1208 (!) 118/56     Pulse Rate 09/27/21 1208 (!) 59     Resp 09/27/21 1208 18     Temp 09/27/21 1208 98.2 F (36.8 C)  Temp Source 09/27/21 1208 Oral     SpO2 09/27/21 1208 100 %     Weight --      Height --      Head Circumference --      Peak Flow --      Pain Score 09/27/21 1209 0     Pain Loc --      Pain Edu? --      Excl. in GC? --    No data found.  Updated Vital Signs BP (!) 118/56 (BP Location: Right Arm)    Pulse (!) 59    Temp 98.2 F (36.8 C) (Oral)    Resp 18    SpO2 100%   Visual Acuity Right Eye Distance:   Left Eye Distance:   Bilateral Distance:    Right Eye Near:   Left Eye Near:    Bilateral Near:     Physical Exam Constitutional:      General: He is not in acute distress.    Appearance: Normal appearance. He is not toxic-appearing or diaphoretic.  HENT:     Head: Normocephalic and atraumatic.     Mouth/Throat:     Lips: Pink.     Mouth: Mucous membranes are moist.     Dentition:  Abnormal dentition. Dental tenderness and gingival swelling present. No dental abscesses.     Comments: Patient has broken tooth to right upper mouth with associated mild gingival swelling surrounding. Eyes:     Extraocular Movements: Extraocular movements intact.     Conjunctiva/sclera: Conjunctivae normal.  Pulmonary:     Effort: Pulmonary effort is normal.  Neurological:     General: No focal deficit present.     Mental Status: He is alert and oriented to person, place, and time. Mental status is at baseline.  Psychiatric:        Mood and Affect: Mood normal.        Behavior: Behavior normal.        Thought Content: Thought content normal.        Judgment: Judgment normal.     UC Treatments / Results  Labs (all labs ordered are listed, but only abnormal results are displayed) Labs Reviewed - No data to display  EKG   Radiology No results found.  Procedures Procedures (including critical care time)  Medications Ordered in UC Medications - No data to display  Initial Impression / Assessment and Plan / UC Course  I have reviewed the triage vital signs and the nursing notes.  Pertinent labs & imaging results that were available during my care of the patient were reviewed by me and considered in my medical decision making (see chart for details).     Will treat dental infection with Augmentin antibiotic.  Viscous lidocaine also prescribed to help alleviate patient's pain.  Patient to follow-up with dentist for further evaluation and management.  Patient verbalized understanding and was agreeable with plan. Final Clinical Impressions(s) / UC Diagnoses   Final diagnoses:  Dental infection  Pain, dental     Discharge Instructions      You have been prescribed an antibiotic for dental infection as well as a lidocaine solution to help with pain.  Please follow-up with dentist for further evaluation and management.    ED Prescriptions     Medication Sig Dispense  Auth. Provider   amoxicillin-clavulanate (AUGMENTIN) 875-125 MG tablet Take 1 tablet by mouth every 12 (twelve) hours. 14 tablet [place], [place], [place]   lidocaine (XYLOCAINE) 2 %  solution Use as directed 15 mLs in the mouth or throat as needed for mouth pain. Swish and spit 100 mL Gustavus Bryant, [place]      PDMP not reviewed this encounter.   Gustavus Bryant, [place] 09/27/21 1224

## 2021-09-27 NOTE — ED Triage Notes (Signed)
Pt c/o "broken tooth" that happened last weekend when eating. States has dental apt next week but need evaluation sooner. Has been using salt water.

## 2021-09-27 NOTE — Discharge Instructions (Signed)
You have been prescribed an antibiotic for dental infection as well as a lidocaine solution to help with pain.  Please follow-up with dentist for further evaluation and management.

## 2022-02-27 ENCOUNTER — Ambulatory Visit
Admission: EM | Admit: 2022-02-27 | Discharge: 2022-02-27 | Disposition: A | Discharge status: Home / Self Care | Payer: Self-pay | Attending: Family Medicine | Admitting: Family Medicine

## 2022-02-27 DIAGNOSIS — K0889 Other specified disorders of teeth and supporting structures: Secondary | ICD-10-CM

## 2022-02-27 MED ORDER — AMOXICILLIN 875 MG PO TABS: 875.0000 mg | ORAL_TABLET | Freq: Two times a day (BID) | ORAL | 0 refills | Status: AC

## 2022-02-27 MED ORDER — IBUPROFEN 800 MG PO TABS: 800.0000 mg | ORAL_TABLET | Freq: Three times a day (TID) | ORAL | 0 refills | Status: DC | PRN

## 2022-02-27 MED ORDER — KETOROLAC TROMETHAMINE 60 MG/2ML IM SOLN
30.0000 mg | Freq: Once | INTRAMUSCULAR | Status: AC
  Administered 2022-02-27: 30 mg via INTRAMUSCULAR

## 2022-02-27 NOTE — ED Triage Notes (Signed)
Patient presents to Urgent Care with complaints of dental pain/swelling on R lower side near a broken tooth since last week. Patient reports he has a dentist appointment on July 5th .

## 2022-02-27 NOTE — ED Provider Notes (Addendum)
[facility]    CSN: [id number] Arrival date & time: 02/27/22  2229      History   Chief Complaint Chief Complaint  Patient presents with   Dental Pain    HPI [name] is a 36 y.o. male.    Dental Pain  Here for right-sided.  He had a little swelling in that area too, and he may have had some fever last night.  He will be seeing a dentist July 5  History reviewed. No pertinent past medical history.  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 No pertinent surgical history.     Home Medications    Prior to Admission medications   Medication Sig Start Date End Date Taking? Authorizing Provider  amoxicillin (AMOXIL) 875 MG tablet Take 1 tablet (875 mg total) by mouth 2 (two) times daily for 7 days. 02/27/22 03/06/22 Yes Elizabeth Paulsen, Janace Aris, MD  ibuprofen (ADVIL) 800 MG tablet Take 1 tablet (800 mg total) by mouth every 8 (eight) hours as needed (pain). 02/27/22  Yes Zenia Resides, MD  alum & mag hydroxide-simeth (MAALOX/MYLANTA) 200-200-20 MG/5ML suspension Take 15 mLs by mouth every 6 (six) hours as needed for indigestion or heartburn. 04/14/19   Wieters, Hallie C, PA-C  omeprazole (PRILOSEC) 20 MG capsule Take 1 capsule (20 mg total) by mouth daily for 14 days. 04/14/19 06/09/19  Wieters, Junius Creamer, PA-C    Family History Family History  Problem Relation Age of Onset   Diabetes Mother    Healthy Father     Social History Social History   Tobacco Use   Smoking status: Former   Smokeless tobacco: Never   Tobacco comments:    quit 8-9 years ago  Vaping Use   Vaping Use: Never used  Substance Use Topics   Alcohol use: Yes    Comment: occ   Drug use: Yes    Types: Marijuana    Comment: daily     Allergies   Garlic   Review of Systems Review of Systems   Physical Exam Triage Vital Signs ED Triage Vitals  Enc  Vitals Group     BP 02/27/22 0850 120/77     Pulse Rate 02/27/22 0850 62     Resp 02/27/22 0850 15     Temp 02/27/22 0850 97.8 F (36.6 C)     Temp src --      SpO2 02/27/22 0850 98 %     Weight --      Height --      Head Circumference --      Peak Flow --      Pain Score 02/27/22 0849 2     Pain Loc --      Pain Edu? --      Excl. in GC? --    No data found.  Updated Vital Signs BP 120/77   Pulse 62   Temp 97.8 F (36.6 C)   Resp 15   SpO2 98%   Visual Acuity Right Eye Distance:   Left Eye Distance:   Bilateral Distance:    Right Eye Near:   Left Eye Near:    Bilateral Near:     Physical Exam Vitals reviewed.  Constitutional:      General: He is not in acute distress.    Appearance: He is not ill-appearing, toxic-appearing or diaphoretic.  HENT:     Mouth/Throat:     Mouth: Mucous membranes are moist.     Comments: There is some dental caries on the right posterior molars on the lower dental ridge.  There is also a little bit of swelling around the area.  No fluctuance.  No mass noted on the external cheek Cardiovascular:     Rate and Rhythm: Normal rate and regular rhythm.  Neurological:     General: No focal deficit present.     Mental Status: He is alert and oriented to person, place, and time.  Psychiatric:        Behavior: Behavior normal.      UC Treatments / Results  Labs (all labs ordered are listed, but only abnormal results are displayed) Labs Reviewed - No data to display  EKG   Radiology No results found.  Procedures Procedures (including critical care time)  Medications Ordered in UC Medications  ketorolac (TORADOL) injection 30 mg (has no administration in time range)    Initial Impression / Assessment and Plan / UC Course  I have reviewed the triage vital signs and the nursing notes.  Pertinent labs & imaging results that were available during my care of the patient were reviewed by me and considered in my medical decision  making (see chart for details).     We will treat with amoxicillin and pain relief. Final Clinical Impressions(s) / UC Diagnoses   Final diagnoses:  Pain, dental     Discharge Instructions      You have been given a shot of Toradol 30 mg today.  Take amoxicillin 875 mg--1 tab twice daily for 7 days  Take ibuprofen 800 mg--1 tab every 8 hours as needed for pain.       ED Prescriptions     Medication Sig Dispense Auth. Provider   amoxicillin (AMOXIL) 875 MG tablet Take 1 tablet (875 mg total) by mouth 2 (two) times daily for 7 days. 14 tablet Catelin Manthe, Janace Aris, MD   ibuprofen (ADVIL) 800 MG tablet Take 1 tablet (800 mg total) by mouth every 8 (eight) hours as needed (pain). 21 tablet Jannice Beitzel, Janace Aris, MD      I have reviewed the PDMP during this encounter.   Zenia Resides, MD 02/27/22 3536    Zenia Resides, MD 02/27/22 [phone]

## 2022-02-27 NOTE — Discharge Instructions (Addendum)
You have been given a shot of Toradol 30 mg today.  Take amoxicillin 875 mg--1 tab twice daily for 7 days  Take ibuprofen 800 mg--1 tab every 8 hours as needed for pain.   

## 2023-06-05 ENCOUNTER — Encounter: Payer: Self-pay | Admitting: *Deleted

## 2023-06-05 ENCOUNTER — Ambulatory Visit
Admission: EM | Admit: 2023-06-05 | Discharge: 2023-06-05 | Disposition: A | Discharge status: Home / Self Care | Payer: Self-pay | Attending: Internal Medicine | Admitting: Internal Medicine

## 2023-06-05 ENCOUNTER — Other Ambulatory Visit: Payer: Self-pay

## 2023-06-05 DIAGNOSIS — R070 Pain in throat: Secondary | ICD-10-CM

## 2023-06-05 DIAGNOSIS — Z1152 Encounter for screening for COVID-19: Secondary | ICD-10-CM | POA: Insufficient documentation

## 2023-06-05 DIAGNOSIS — B349 Viral infection, unspecified: Secondary | ICD-10-CM

## 2023-06-05 LAB — SARS CORONAVIRUS 2 (TAT 6-24 HRS): SARS Coronavirus 2: NEGATIVE

## 2023-06-05 LAB — POCT RAPID STREP A (OFFICE): Rapid Strep A Screen: NEGATIVE

## 2023-06-05 NOTE — ED Provider Notes (Signed)
[facility]    CSN: [id number] Arrival date & time: 06/05/23  0810      History   Chief Complaint Chief Complaint  Patient presents with   Fatigue    HPI [name] is a 37 y.o. male.   Patient presents with not feeling well for approximately 6 days.  Patient states that he has had fatigue, feelings of "throat swelling", diarrhea, body aches, chills.  Reports his girlfriend was sick recently as well.  He denies any obvious fever, nasal congestion, runny nose, coughing.  States that his throat feels uncomfortable but is not necessarily sore.  Last BM movement was yesterday and was formed.  Reports diarrhea has now resolved.  Denies nausea or vomiting.  Denies any recent unfavorable foods or travel outside the [place].     History reviewed. No pertinent past medical history.  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 No pertinent surgical history.     Home Medications    Prior to Admission medications   Medication Sig Start Date End Date Taking? Authorizing Provider  alum & mag hydroxide-simeth (MAALOX/MYLANTA) 200-200-20 MG/5ML suspension Take 15 mLs by mouth every 6 (six) hours as needed for indigestion or heartburn. 04/14/19   Wieters, Hallie C, PA-C  ibuprofen (ADVIL) 800 MG tablet Take 1 tablet (800 mg total) by mouth every 8 (eight) hours as needed (pain). 02/27/22   Zenia Resides, MD  omeprazole (PRILOSEC) 20 MG capsule Take 1 capsule (20 mg total) by mouth daily for 14 days. 04/14/19 06/09/19  Wieters, Junius Creamer, PA-C    Family History Family History  Problem Relation Age of Onset   Diabetes Mother    Healthy Father     Social History Social History   Tobacco Use   Smoking status: Former   Smokeless tobacco: Never   Tobacco comments:    quit 8-9 years ago  Vaping Use   Vaping status: Never Used  Substance Use Topics    Alcohol use: Yes    Comment: occ   Drug use: Yes    Types: Marijuana    Comment: daily     Allergies   Garlic   Review of Systems Review of Systems Per HPI  Physical Exam Triage Vital Signs ED Triage Vitals  Encounter Vitals Group     BP 06/05/23 0837 125/84     Systolic BP Percentile --      Diastolic BP Percentile --      Pulse Rate 06/05/23 0837 67     Resp 06/05/23 0837 18     Temp 06/05/23 0837 98 F (36.7 C)     Temp Source 06/05/23 0837 Oral     SpO2 06/05/23 0837 97 %     Weight 06/05/23 0839 150 lb (68 kg)     Height 06/05/23 0839 5\' 7"  (1.702 m)     Head Circumference --      Peak Flow --      Pain Score 06/05/23 0839 0     Pain Loc --      Pain Education --      Exclude from Growth Chart --    No data found.  Updated Vital Signs BP 125/84 (BP Location: Left Arm)   Pulse 67   Temp 98 F (36.7 C) (Oral)   Resp 18   Ht 5\' 7"  (1.702 m)  Wt 150 lb (68 kg)   SpO2 97%   BMI 23.49 kg/m   Visual Acuity Right Eye Distance:   Left Eye Distance:   Bilateral Distance:    Right Eye Near:   Left Eye Near:    Bilateral Near:     Physical Exam Constitutional:      General: He is not in acute distress.    Appearance: Normal appearance. He is not toxic-appearing or diaphoretic.  HENT:     Head: Normocephalic and atraumatic.     Right Ear: Tympanic membrane and ear canal normal.     Left Ear: Tympanic membrane and ear canal normal.     Nose: Nose normal.     Mouth/Throat:     Mouth: Mucous membranes are moist.     Pharynx: Posterior oropharyngeal erythema present. No pharyngeal swelling.  Eyes:     Extraocular Movements: Extraocular movements intact.     Conjunctiva/sclera: Conjunctivae normal.  Cardiovascular:     Rate and Rhythm: Normal rate and regular rhythm.     Pulses: Normal pulses.     Heart sounds: Normal heart sounds.  Pulmonary:     Effort: Pulmonary effort is normal. No respiratory distress.     Breath sounds: Normal breath  sounds.  Abdominal:     General: Bowel sounds are normal. There is no distension.     Palpations: Abdomen is soft.     Tenderness: There is no abdominal tenderness.  Musculoskeletal:        General: Normal range of motion.     Cervical back: Normal range of motion.  Skin:    General: Skin is warm and dry.  Neurological:     General: No focal deficit present.     Mental Status: He is alert and oriented to person, place, and time. Mental status is at baseline.  Psychiatric:        Mood and Affect: Mood normal.        Behavior: Behavior normal.        Thought Content: Thought content normal.        Judgment: Judgment normal.      UC Treatments / Results  Labs (all labs ordered are listed, but only abnormal results are displayed) Labs Reviewed  CULTURE, GROUP A STREP ([facility])  SARS CORONAVIRUS 2 (TAT 6-24 HRS)  POCT RAPID STREP A (OFFICE)    EKG   Radiology No results found.  Procedures Procedures (including critical care time)  Medications Ordered in UC Medications - No data to display  Initial Impression / Assessment and Plan / UC Course  I have reviewed the triage vital signs and the nursing notes.  Pertinent labs & imaging results that were available during my care of the patient were reviewed by me and considered in my medical decision making (see chart for details).     Suspect viral cause to symptoms.  Patient is reporting feelings of throat swelling and discomfort but on physical exam, throat does not appear to be swollen but is erythematous.  Therefore, rapid strep was completed that was negative.  Throat culture pending.  Patient requesting COVID testing which I do think is reasonable so this is pending.  No signs of acute abdomen or dehydration exam and diarrhea has now resolved which is reassuring.  Offered patient basic blood work to evaluate but he declined this.  Therefore, recommended supportive care and symptom management as it should run its course.   Advised strict return precautions if any symptoms persist or  worsen.  Patient verbalized understanding and was agreeable with plan. Final Clinical Impressions(s) / UC Diagnoses   Final diagnoses:  Viral illness  Throat discomfort     Discharge Instructions      Rapid strep is negative.  Throat culture and COVID test pending.  As we discussed, this is most likely viral and should simply run its course.  If symptoms persist or worsen, please follow-up further evaluation and management.     ED Prescriptions   None    PDMP not reviewed this encounter.   Gustavus Bryant, [place] 06/05/23 [id number]

## 2023-06-05 NOTE — Discharge Instructions (Signed)
Rapid strep is negative.  Throat culture and COVID test pending.  As we discussed, this is most likely viral and should simply run its course.  If symptoms persist or worsen, please follow-up further evaluation and management.

## 2023-06-05 NOTE — ED Triage Notes (Signed)
Pt c/o fatigue, gas, "can't smell or taste very good" x 1 week. Denies cough. States he feels like "he can't get his wind through his throat"

## 2023-06-06 LAB — CULTURE, GROUP A STREP (THRC)

## 2023-06-22 ENCOUNTER — Emergency Department (HOSPITAL_COMMUNITY)
Admission: EM | Admit: 2023-06-22 | Discharge: 2023-06-23 | Disposition: A | Discharge status: Home / Self Care | Payer: Self-pay | Attending: Emergency Medicine | Admitting: Emergency Medicine

## 2023-06-22 ENCOUNTER — Other Ambulatory Visit: Payer: Self-pay

## 2023-06-22 DIAGNOSIS — R079 Chest pain, unspecified: Secondary | ICD-10-CM

## 2023-06-22 DIAGNOSIS — R0789 Other chest pain: Secondary | ICD-10-CM | POA: Insufficient documentation

## 2023-06-22 MED ORDER — IBUPROFEN 800 MG PO TABS
800.0000 mg | ORAL_TABLET | Freq: Once | ORAL | Status: AC
  Administered 2023-06-23: 800 mg via ORAL
  Filled 2023-06-22: qty 1

## 2023-06-22 NOTE — ED Provider Notes (Incomplete)
[place] EMERGENCY DEPARTMENT AT [facility] Provider Note   CSN: [id number] Arrival date & time: 06/22/23  2316     History {Add pertinent medical, surgical, social history, OB history to HPI:1} Chief Complaint  Patient presents with  . Chest Pain   HPI [name] is a 37 y.o. male presenting for chest pain.  Started all of a sudden around 4 PM today.  Patient was closing a folding chair when he felt a sharp pain in the left lateral chest wall.  Denies associated shortness of breath.  No diaphoresis no nausea.  The pain has been lingering which prompted him to be evaluated today.  It is worse with movement of his left arm.  Also it is reproducible.  Took some Tylenol this evening which helped a little.   Chest Pain      Home Medications Prior to Admission medications   Medication Sig Start Date End Date Taking? Authorizing Provider  alum & mag hydroxide-simeth (MAALOX/MYLANTA) 200-200-20 MG/5ML suspension Take 15 mLs by mouth every 6 (six) hours as needed for indigestion or heartburn. 04/14/19   Wieters, Hallie C, PA-C  ibuprofen (ADVIL) 800 MG tablet Take 1 tablet (800 mg total) by mouth every 8 (eight) hours as needed (pain). 02/27/22   Zenia Resides, MD  omeprazole (PRILOSEC) 20 MG capsule Take 1 capsule (20 mg total) by mouth daily for 14 days. 04/14/19 06/09/19  Wieters, Hallie C, PA-C      Allergies    Garlic    Review of Systems   Review of Systems  Cardiovascular:  Positive for chest pain.    Physical Exam Updated Vital Signs BP 134/82 (BP Location: Left Arm)   Pulse 73   Temp 98.1 F (36.7 C) (Oral)   Resp (!) 23   Ht 5\' 7"  (1.702 m)   Wt 68 kg   SpO2 99%   BMI 23.49 kg/m  Physical Exam Vitals and nursing note reviewed.  HENT:     Head: Normocephalic and atraumatic.     Mouth/Throat:     Mouth: Mucous membranes are moist.  Eyes:     General:        Right eye: No discharge.        Left eye: No discharge.     Conjunctiva/sclera:  Conjunctivae normal.  Cardiovascular:     Rate and Rhythm: Normal rate and regular rhythm.     Pulses: Normal pulses.     Heart sounds: Normal heart sounds.  Pulmonary:     Effort: Pulmonary effort is normal.     Breath sounds: Normal breath sounds.  Abdominal:     General: Abdomen is flat.     Palpations: Abdomen is soft.  Skin:    General: Skin is warm and dry.  Neurological:     General: No focal deficit present.  Psychiatric:        Mood and Affect: Mood normal.     ED Results / Procedures / Treatments   Labs (all labs ordered are listed, but only abnormal results are displayed) Labs Reviewed  BASIC METABOLIC PANEL  CBC  TROPONIN I (HIGH SENSITIVITY)    EKG None  Radiology No results found.  Procedures Procedures  {Document cardiac monitor, telemetry assessment procedure when appropriate:1}  Medications Ordered in ED Medications  ibuprofen (ADVIL) tablet 800 mg (has no administration in time range)    ED Course/ Medical Decision Making/ A&P   {   Click here for ABCD2, HEART and other  calculatorsREFRESH Note before signing :1}                              Medical Decision Making  ***  {Document critical care time when appropriate:1} {Document review of labs and clinical decision tools ie heart score, Chads2Vasc2 etc:1}  {Document your independent review of radiology images, and any outside records:1} {Document your discussion with family members, caretakers, and with consultants:1} {Document social determinants of health affecting pt's care:1} {Document your decision making why or why not admission, treatments were needed:1} Final Clinical Impression(s) / ED Diagnoses Final diagnoses:  None    Rx / DC Orders ED Discharge Orders     None

## 2023-06-22 NOTE — ED Provider Notes (Signed)
[place] EMERGENCY DEPARTMENT AT [facility] Provider Note   CSN: [id number] Arrival date & time: 06/22/23  2316     History  Chief Complaint  Patient presents with   Chest Pain   HPI [name] is a 37 y.o. male presenting for chest pain.  Started all of a sudden around 4 PM today.  Patient was closing a folding chair when he felt a sharp pain in the left lateral chest wall.  Denies associated shortness of breath.  No diaphoresis no nausea.  The pain has been lingering which prompted him to be evaluated today.  It is worse with movement of his left arm.  Also it is reproducible.  Took some Tylenol this evening which helped a little.   Chest Pain      Home Medications Prior to Admission medications   Medication Sig Start Date End Date Taking? Authorizing Provider  acetaminophen (TYLENOL) 500 MG tablet Take 500 mg by mouth every 6 (six) hours as needed for moderate pain.   Yes [provider]  alum & mag hydroxide-simeth (MAALOX/MYLANTA) 200-200-20 MG/5ML suspension Take 15 mLs by mouth every 6 (six) hours as needed for indigestion or heartburn. Patient not taking: Reported on 06/23/2023 04/14/19   Wieters, Hallie C, PA-C  ibuprofen (ADVIL) 800 MG tablet Take 1 tablet (800 mg total) by mouth every 8 (eight) hours as needed (pain). Patient not taking: Reported on 06/23/2023 02/27/22   Zenia Resides, MD  omeprazole (PRILOSEC) 20 MG capsule Take 1 capsule (20 mg total) by mouth daily for 14 days. 04/14/19 06/09/19  Wieters, Hallie C, PA-C      Allergies    Garlic    Review of Systems   Review of Systems  Cardiovascular:  Positive for chest pain.    Physical Exam Updated Vital Signs BP 117/63   Pulse (!) 57   Temp 98.1 F (36.7 C) (Oral)   Resp (!) 21   Ht 5\' 7"  (1.702 m)   Wt 68 kg   SpO2 99%   BMI 23.49 kg/m  Physical Exam Vitals and nursing note reviewed.  HENT:     Head: Normocephalic and atraumatic.     Mouth/Throat:     Mouth:  Mucous membranes are moist.  Eyes:     General:        Right eye: No discharge.        Left eye: No discharge.     Conjunctiva/sclera: Conjunctivae normal.  Cardiovascular:     Rate and Rhythm: Normal rate and regular rhythm.     Pulses: Normal pulses.     Heart sounds: Normal heart sounds.  Pulmonary:     Effort: Pulmonary effort is normal.     Breath sounds: Normal breath sounds.  Chest:    Abdominal:     General: Abdomen is flat.     Palpations: Abdomen is soft.  Skin:    General: Skin is warm and dry.  Neurological:     General: No focal deficit present.  Psychiatric:        Mood and Affect: Mood normal.     ED Results / Procedures / Treatments   Labs (all labs ordered are listed, but only abnormal results are displayed) Labs Reviewed  BASIC METABOLIC PANEL - Abnormal; Notable for the following components:      Result Value   Potassium 3.4 (*)    Glucose, Bld 109 (*)    All other components within normal limits  CBC -  Abnormal; Notable for the following components:   RBC 4.13 (*)    HCT 38.3 (*)    All other components within normal limits  TROPONIN I (HIGH SENSITIVITY)  TROPONIN I (HIGH SENSITIVITY)    EKG None  Radiology DG Chest Port 1 View  Result Date: 06/23/2023 CLINICAL DATA:  Chest pain EXAM: PORTABLE CHEST 1 VIEW COMPARISON:  Chest x-ray 06/21/2019 FINDINGS: The heart size and mediastinal contours are within normal limits. Both lungs are clear. The visualized skeletal structures are unremarkable. IMPRESSION: No active disease. Electronically Signed   By: Darliss Cheney M.D.   On: 06/23/2023 00:17    Procedures Procedures    Medications Ordered in ED Medications  ibuprofen (ADVIL) tablet 800 mg (800 mg Oral Given 06/23/23 0023)    ED Course/ Medical Decision Making/ A&P                                 Medical Decision Making Amount and/or Complexity of Data Reviewed Labs: ordered. Radiology: ordered.  Risk Prescription drug  management.   37 year old well-appearing male presenting for chest pain. Exam notable for left-sided chest wall tenderness.  DDx includes ACS, PE, rib fracture, pneumothorax, pneumonia, other.  Patient overall looks clinically well.  Suspect MSK pain given the nature of onset of the pain and the fact that it is reproducible and sharp.  Workup does not suggest ACS.  Also consider PE but unlikely given that he is PERC negative.  Chest pain did improve after ibuprofen.  Vitals remained stable.  Discussed return precautions.  Advised to follow-up with PCP.        Final Clinical Impression(s) / ED Diagnoses Final diagnoses:  Chest pain, unspecified type    Rx / DC Orders ED Discharge Orders     None         Gareth Eagle, PA-C 06/23/23 0410    Dione Booze, MD 06/23/23 [phone]

## 2023-06-22 NOTE — ED Provider Notes (Incomplete)
ED ECG REPORT   Date: 06/22/2023  Rate: 63  Rhythm: normal sinus rhythm  QRS Axis: right  Intervals: normal  ST/T Wave abnormalities: normal  Conduction Disutrbances:none  Narrative Interpretation: Borderline right axis deviation, otherwise normal ECG.  When compared with ECG on 07/17/2018, no significant changes are seen.  Old EKG Reviewed: unchanged  I have personally reviewed the EKG tracing and agree with the computerized printout as noted.

## 2023-06-22 NOTE — ED Triage Notes (Signed)
Pt reports sharp left sided chest pain to the left when he went to pick up a folding chair at this football game. Pain radiates into the left arm and worsens with movement. No associated s/s.

## 2023-06-23 ENCOUNTER — Emergency Department (HOSPITAL_COMMUNITY): Payer: Self-pay

## 2023-06-23 LAB — BASIC METABOLIC PANEL
Anion gap: 9 (ref 5–15)
BUN: 9 mg/dL (ref 6–20)
CO2: 28 mmol/L (ref 22–32)
Calcium: 9 mg/dL (ref 8.9–10.3)
Chloride: 103 mmol/L (ref 98–111)
Creatinine, Ser: 0.85 mg/dL (ref 0.61–1.24)
GFR, Estimated: >60 mL/min (ref >60)
Glucose, Bld: 109 mg/dL — ABNORMAL HIGH (ref 70–99)
Potassium: 3.4 mmol/L — ABNORMAL LOW (ref 3.5–5.1)
Sodium: 140 mmol/L (ref 135–145)

## 2023-06-23 LAB — CBC
HCT: 38.3 % — ABNORMAL LOW (ref 39.0–52.0)
Hemoglobin: 13 g/dL (ref 13.0–17.0)
MCH: 31.5 pg (ref 26.0–34.0)
MCHC: 33.9 g/dL (ref 30.0–36.0)
MCV: 92.7 fL (ref 80.0–100.0)
Platelets: 263 10*3/uL (ref 150–400)
RBC: 4.13 MIL/uL — ABNORMAL LOW (ref 4.22–5.81)
RDW: 13.1 % (ref 11.5–15.5)
WBC: 7.1 10*3/uL (ref 4.0–10.5)
nRBC: 0 % (ref 0.0–0.2)

## 2023-06-23 LAB — TROPONIN I (HIGH SENSITIVITY): Troponin I (High Sensitivity): 3 ng/L (ref <18)

## 2023-06-23 NOTE — Discharge Instructions (Addendum)
Evaluation for chest pain is overall reassuring.  I do suspect it is muscle pain.  Recommend to treat it conservatively at home.  You can apply ice where it hurts 3-4 times a day for the next 3 days.  He can also take ibuprofen.  He can also take Tylenol as well if you need another pain medication.  Recommend you follow-up with PCP.  If your chest pain returns, you have shortness of breath, calf tenderness or any other concerning symptom please return emerged part further evaluation.

## 2023-10-14 ENCOUNTER — Emergency Department (HOSPITAL_COMMUNITY)
Admission: EM | Admit: 2023-10-14 | Discharge: 2023-10-14 | Disposition: A | Discharge status: Home / Self Care | Payer: Self-pay | Attending: Emergency Medicine | Admitting: Emergency Medicine

## 2023-10-14 ENCOUNTER — Encounter (HOSPITAL_COMMUNITY): Payer: Self-pay

## 2023-10-14 ENCOUNTER — Emergency Department (HOSPITAL_COMMUNITY): Payer: Self-pay

## 2023-10-14 DIAGNOSIS — J069 Acute upper respiratory infection, unspecified: Secondary | ICD-10-CM | POA: Insufficient documentation

## 2023-10-14 DIAGNOSIS — Z20822 Contact with and (suspected) exposure to covid-19: Secondary | ICD-10-CM | POA: Insufficient documentation

## 2023-10-14 LAB — COMPREHENSIVE METABOLIC PANEL
ALT: 21 U/L (ref 0–44)
AST: 25 U/L (ref 15–41)
Albumin: 4 g/dL (ref 3.5–5.0)
Alkaline Phosphatase: 52 U/L (ref 38–126)
Anion gap: 10 (ref 5–15)
BUN: 16 mg/dL (ref 6–20)
CO2: 23 mmol/L (ref 22–32)
Calcium: 9.2 mg/dL (ref 8.9–10.3)
Chloride: 106 mmol/L (ref 98–111)
Creatinine, Ser: 0.93 mg/dL (ref 0.61–1.24)
GFR, Estimated: >60 mL/min (ref >60)
Glucose, Bld: 134 mg/dL — ABNORMAL HIGH (ref 70–99)
Potassium: 3.2 mmol/L — ABNORMAL LOW (ref 3.5–5.1)
Sodium: 139 mmol/L (ref 135–145)
Total Bilirubin: 1.7 mg/dL — ABNORMAL HIGH (ref 0.0–1.2)
Total Protein: 7.5 g/dL (ref 6.5–8.1)

## 2023-10-14 LAB — RESP PANEL BY RT-PCR (RSV, FLU A&B, COVID)  RVPGX2
Influenza A by PCR: NEGATIVE
Influenza B by PCR: NEGATIVE
Resp Syncytial Virus by PCR: NEGATIVE
SARS Coronavirus 2 by RT PCR: NEGATIVE

## 2023-10-14 LAB — CBC
HCT: 41.2 % (ref 39.0–52.0)
Hemoglobin: 14.1 g/dL (ref 13.0–17.0)
MCH: 31.7 pg (ref 26.0–34.0)
MCHC: 34.2 g/dL (ref 30.0–36.0)
MCV: 92.6 fL (ref 80.0–100.0)
Platelets: 243 10*3/uL (ref 150–400)
RBC: 4.45 MIL/uL (ref 4.22–5.81)
RDW: 13 % (ref 11.5–15.5)
WBC: 9.5 10*3/uL (ref 4.0–10.5)
nRBC: 0 % (ref 0.0–0.2)

## 2023-10-14 LAB — TROPONIN I (HIGH SENSITIVITY): Troponin I (High Sensitivity): 3 ng/L (ref <18)

## 2023-10-14 LAB — LIPASE, BLOOD: Lipase: 30 U/L (ref 11–51)

## 2023-10-14 MED ORDER — NAPROXEN 500 MG PO TABS: 500.0000 mg | ORAL_TABLET | Freq: Two times a day (BID) | ORAL | 0 refills | Status: AC

## 2023-10-14 MED ORDER — POTASSIUM CHLORIDE CRYS ER 20 MEQ PO TBCR
40.0000 meq | EXTENDED_RELEASE_TABLET | Freq: Once | ORAL | Status: AC
  Administered 2023-10-14: 40 meq via ORAL
  Filled 2023-10-14: qty 2

## 2023-10-14 MED ORDER — KETOROLAC TROMETHAMINE 15 MG/ML IJ SOLN
15.0000 mg | Freq: Once | INTRAMUSCULAR | Status: AC
  Administered 2023-10-14: 15 mg via INTRAVENOUS
  Filled 2023-10-14: qty 1

## 2023-10-14 MED ORDER — POTASSIUM CHLORIDE CRYS ER 20 MEQ PO TBCR
20.0000 meq | EXTENDED_RELEASE_TABLET | Freq: Two times a day (BID) | ORAL | 0 refills | Status: AC
Start: 2023-10-14 — End: 2023-10-17

## 2023-10-14 NOTE — ED Provider Notes (Cosign Needed)
[place] EMERGENCY DEPARTMENT AT [facility] Provider Note   CSN: [id number] Arrival date & time: 10/14/23  0410     History  Chief Complaint  Patient presents with   Shortness of Breath    [name] is a 38 y.o. male with no significant past medical history who presents the ED today for shortness of breath.  Patient reports yesterday morning he woke up feeling short of breath and tightness in his chest with coughing.  Additionally, he he endorses epigastric pain that comes and goes.  He states that he believes he will throw up earlier which did not improve the symptoms.  Denies urinary symptoms or changes to bowel habits.  No fevers at home.  Admits to recent sick contacts.  He has not taken anything for symptoms prior to arrival.  No additional complaints or concerns at this time.    Home Medications Prior to Admission medications   Medication Sig Start Date End Date Taking? Authorizing Provider  naproxen (NAPROSYN) 500 MG tablet Take 1 tablet (500 mg total) by mouth 2 (two) times daily for 7 days. 10/14/23 10/21/23 Yes Maxwell Marion, PA-C  acetaminophen (TYLENOL) 500 MG tablet Take 500 mg by mouth every 6 (six) hours as needed for moderate pain.    [provider]  alum & mag hydroxide-simeth (MAALOX/MYLANTA) 200-200-20 MG/5ML suspension Take 15 mLs by mouth every 6 (six) hours as needed for indigestion or heartburn. Patient not taking: Reported on 06/23/2023 04/14/19   Wieters, Hallie C, PA-C  ibuprofen (ADVIL) 800 MG tablet Take 1 tablet (800 mg total) by mouth every 8 (eight) hours as needed (pain). Patient not taking: Reported on 06/23/2023 02/27/22   Zenia Resides, MD  omeprazole (PRILOSEC) 20 MG capsule Take 1 capsule (20 mg total) by mouth daily for 14 days. 04/14/19 06/09/19  Wieters, Hallie C, PA-C      Allergies    Garlic    Review of Systems   Review of Systems  Respiratory:  Positive for shortness of breath.   All other systems reviewed and  are negative.   Physical Exam Updated Vital Signs BP 114/73   Pulse 68   Temp 98.3 F (36.8 C) (Oral)   Resp 16   SpO2 100%  Physical Exam Vitals and nursing note reviewed.  Constitutional:      General: He is not in acute distress.    Appearance: Normal appearance.  HENT:     Head: Normocephalic and atraumatic.     Mouth/Throat:     Mouth: Mucous membranes are moist.  Eyes:     Conjunctiva/sclera: Conjunctivae normal.     Pupils: Pupils are equal, round, and reactive to light.  Cardiovascular:     Rate and Rhythm: Normal rate and regular rhythm.     Pulses: Normal pulses.     Heart sounds: Normal heart sounds.  Pulmonary:     Effort: Pulmonary effort is normal.     Breath sounds: Normal breath sounds.     Comments: Oxygen saturation of 100% on room air Abdominal:     Palpations: Abdomen is soft.     Tenderness: There is abdominal tenderness.     Comments: Epigastric tenderness to palpation  Musculoskeletal:        General: Normal range of motion.     Cervical back: Normal range of motion.  Skin:    General: Skin is warm and dry.     Findings: No rash.  Neurological:     General:  No focal deficit present.     Mental Status: He is alert.  Psychiatric:        Mood and Affect: Mood normal.        Behavior: Behavior normal.    ED Results / Procedures / Treatments   Labs (all labs ordered are listed, but only abnormal results are displayed) Labs Reviewed  COMPREHENSIVE METABOLIC PANEL - Abnormal; Notable for the following components:      Result Value   Potassium 3.2 (*)    Glucose, Bld 134 (*)    Total Bilirubin 1.7 (*)    All other components within normal limits  RESP PANEL BY RT-PCR (RSV, FLU A&B, COVID)  RVPGX2  CBC  LIPASE, BLOOD  TROPONIN I (HIGH SENSITIVITY)    EKG None  Radiology DG Chest 2 View Result Date: 10/14/2023 CLINICAL DATA:  Headache and chest pain.  Abdominal pain EXAM: CHEST - 2 VIEW COMPARISON:  06/23/2023 FINDINGS: Normal heart  size and mediastinal contours. No acute infiltrate or edema. No effusion or pneumothorax. No acute osseous findings. IMPRESSION: No active cardiopulmonary disease. Electronically Signed   By: Tiburcio Pea M.D.   On: 10/14/2023 05:20    Procedures Procedures: not indicated.   Medications Ordered in ED Medications  potassium chloride SA (KLOR-CON M) CR tablet 40 mEq (has no administration in time range)  ketorolac (TORADOL) 15 MG/ML injection 15 mg (15 mg Intravenous Given 10/14/23 1610)    ED Course/ Medical Decision Making/ A&P                                 Medical Decision Making Amount and/or Complexity of Data Reviewed Labs: ordered. Radiology: ordered.  Risk Prescription drug management.   This patient presents to the ED for concern of shortness of breath, this involves an extensive number of treatment options, and is a complaint that carries with it a high risk of complications and morbidity.   Differential diagnosis includes: COVID, flu, RSV, other URI, pneumonia, ACS, etc.   Comorbidities  No significant past medical history   Additional History  Additional history obtained from prior records   Cardiac Monitoring / EKG  The patient was maintained on a cardiac monitor.  I personally viewed and interpreted the cardiac monitored which showed: sinus rhythm with a heart rate of 69 bpm.   Lab Tests  I ordered and personally interpreted labs.  The pertinent results include:   Negative respiratory swab Negative troponin CMP, lipase and CBC are within normal limits - no acute electrolyte derangement, AKI, infection, or anemia   Imaging Studies  I ordered imaging studies including CXR  I independently visualized and interpreted imaging which showed: No active cardiopulmonary disease I agree with the radiologist interpretation   Problem List / ED Course / Critical Interventions / Medication Management  Shortness of breath and chest pain with cough since  yesterday. I ordered medications including: Toradol for chest discomfort with coughing/headache Reevaluation of the patient after these medicines showed that the patient improved I have reviewed the patients home medicines and have made adjustments as needed Discussed findings with patient and family member at bedside.  All questions were answered.   Social Determinants of Health  Marijuana use   Test / Admission - Considered  Patient is hemodynamically stable and safe for discharge home. Return precautions given.       Final Clinical Impression(s) / ED Diagnoses Final diagnoses:  Upper respiratory tract  infection, unspecified type    Rx / DC Orders ED Discharge Orders          Ordered    naproxen (NAPROSYN) 500 MG tablet  2 times daily        10/14/23 0904              Maxwell Marion, PA-C 10/14/23 [phone]

## 2023-10-14 NOTE — ED Triage Notes (Signed)
Patient arrived with complaints of headache, chest pain, abdominal pain and shortness of breath since yesterday.

## 2023-10-14 NOTE — Discharge Instructions (Addendum)
As discussed, your labs and imaging are reassuring.  Take naproxen twice a day as needed for chest discomfort, headaches, and bodyaches. I have sent potassium to your pharmacy as well. Take this twice a day for the next 3 days to help increase your potassium levels.  Follow-up with your primary care provider in the next week for reevaluation of your symptoms and potassium level.  Get help right away if: You have shortness of breath that gets worse. You have very bad or constant: Headache. Ear pain. Pain in your forehead, behind your eyes, and over your cheekbones (sinus pain). Chest pain. You have long-lasting (chronic) lung disease along with any of these: Making high-pitched whistling sounds when you breathe, most often when you breathe out (wheezing). Long-lasting cough (more than 14 days). Coughing up blood. A change in your usual mucus. You have a stiff neck. You have changes in your: Vision. Hearing. Thinking. Mood.

## 2024-05-28 ENCOUNTER — Ambulatory Visit
Admission: EM | Admit: 2024-05-28 | Discharge: 2024-05-28 | Disposition: A | Discharge status: Home / Self Care | Payer: Self-pay | Source: Ambulatory Visit | Attending: Family Medicine | Admitting: Family Medicine

## 2024-05-28 DIAGNOSIS — K047 Periapical abscess without sinus: Secondary | ICD-10-CM

## 2024-05-28 MED ORDER — AMOXICILLIN-POT CLAVULANATE 875-125 MG PO TABS: 1.0000 | ORAL_TABLET | Freq: Two times a day (BID) | ORAL | 0 refills | Status: AC

## 2024-05-28 MED ORDER — IBUPROFEN 800 MG PO TABS: 800.0000 mg | ORAL_TABLET | Freq: Three times a day (TID) | ORAL | 0 refills | Status: AC | PRN

## 2024-05-28 NOTE — Discharge Instructions (Signed)
Take amoxicillin-clavulanate 875 mg--1 tab twice daily with food for 7 days  Take ibuprofen 800 mg--1 tab every 8 hours as needed for pain.

## 2024-05-28 NOTE — ED Triage Notes (Addendum)
 Patient reports broken tooth and now in pain. First noticed about a month ago but about a week ago the pain became more apparent and made dental appointment for Oct. 2025. No fever. Pain location: Right lower (back).  Started using Tylenol  PM (helping some).

## 2024-05-28 NOTE — ED Provider Notes (Signed)
 [facility]    CSN: [id number] Arrival date & time: 05/28/24  1139      History   Chief Complaint Chief Complaint  Patient presents with   Dental Pain    HPI [name] is a 38 y.o. male.    Dental Pain  Here for pain and swelling in his right mouth.  He has made an appointment with his dentist for early October.  The pain started bothering him about 10 days ago.  No fever or chills  NKDA   History reviewed. No pertinent past medical history.  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 No pertinent surgical history.     Home Medications    Prior to Admission medications   Medication Sig Start Date End Date Taking? Authorizing Provider  amoxicillin -clavulanate (AUGMENTIN ) 875-125 MG tablet Take 1 tablet by mouth 2 (two) times daily for 7 days. 05/28/24 06/04/24 Yes Terry Bolotin, Sharlet POUR, MD  diphenhydramine-acetaminophen  (TYLENOL  PM) 25-500 MG TABS tablet Take 1 tablet by mouth at bedtime as needed.   Yes [provider]  ibuprofen  (ADVIL ) 800 MG tablet Take 1 tablet (800 mg total) by mouth every 8 (eight) hours as needed (pain). 05/28/24  Yes Vonna Sharlet POUR, MD  acetaminophen  (TYLENOL ) 500 MG tablet Take 500 mg by mouth every 6 (six) hours as needed for moderate pain.    [provider]  alum & mag hydroxide-simeth (MAALOX/MYLANTA) 200-200-20 MG/5ML suspension Take 15 mLs by mouth every 6 (six) hours as needed for indigestion or heartburn. Patient not taking: Reported on 06/23/2023 04/14/19   Wieters, Hallie C, PA-C  omeprazole  (PRILOSEC) 20 MG capsule Take 1 capsule (20 mg total) by mouth daily for 14 days. 04/14/19 06/09/19  Wieters, Hallie C, PA-C  potassium chloride  SA (KLOR-CON  M) 20 MEQ tablet Take 1 tablet (20 mEq total) by mouth 2 (two) times daily for 3 days. 10/14/23 10/17/23  Waddell Sluder, PA-C    Family  History Family History  Problem Relation Age of Onset   Diabetes Mother    Healthy Father     Social History Social History   Tobacco Use   Smoking status: Former    Types: Cigarettes   Smokeless tobacco: Never   Tobacco comments:    quit 8-9 years ago  Vaping Use   Vaping status: Never Used  Substance Use Topics   Alcohol use: Yes    Comment: Occassionally.   Drug use: Yes    Types: Marijuana    Comment: daily     Allergies   Garlic   Review of Systems Review of Systems   Physical Exam Triage Vital Signs ED Triage Vitals  Encounter Vitals Group     BP 05/28/24 1221 100/63     Girls Systolic BP Percentile --      Girls Diastolic BP Percentile --      Boys Systolic BP Percentile --      Boys Diastolic BP Percentile --      Pulse Rate 05/28/24 1221 (!) 58     Resp 05/28/24 1221 18     Temp 05/28/24 1221 98 F (36.7 C)     Temp Source 05/28/24 1221 Oral     SpO2 05/28/24 1221 98 %     Weight 05/28/24 1219 150 lb (68 kg)     Height 05/28/24 1219 5' 7 (1.702 m)  Head Circumference --      Peak Flow --      Pain Score 05/28/24 1216 3     Pain Loc --      Pain Education --      Exclude from Growth Chart --    No data found.  Updated Vital Signs BP 100/63 (BP Location: Left Arm)   Pulse (!) 58   Temp 98 F (36.7 C) (Oral)   Resp 18   Ht 5' 7 (1.702 m)   Wt 68 kg   SpO2 98%   BMI 23.49 kg/m   Visual Acuity Right Eye Distance:   Left Eye Distance:   Bilateral Distance:    Right Eye Near:   Left Eye Near:    Bilateral Near:     Physical Exam Vitals reviewed.  Constitutional:      General: He is not in acute distress.    Appearance: He is not ill-appearing, toxic-appearing or diaphoretic.  HENT:     Mouth/Throat:     Mouth: Mucous membranes are moist.     Comments: There is some swelling of the oral mucosa on the posterior right lower teeth. Eyes:     Extraocular Movements: Extraocular movements intact.     Conjunctiva/sclera:  Conjunctivae normal.     Pupils: Pupils are equal, round, and reactive to light.  Cardiovascular:     Rate and Rhythm: Normal rate and regular rhythm.     Heart sounds: No murmur heard. Pulmonary:     Effort: Pulmonary effort is normal.     Breath sounds: Normal breath sounds.  Musculoskeletal:     Cervical back: Neck supple.  Lymphadenopathy:     Cervical: No cervical adenopathy.  Skin:    Coloration: Skin is not pale.  Neurological:     General: No focal deficit present.     Mental Status: He is alert and oriented to person, place, and time.  Psychiatric:        Behavior: Behavior normal.      UC Treatments / Results  Labs (all labs ordered are listed, but only abnormal results are displayed) Labs Reviewed - No data to display  EKG   Radiology No results found.  Procedures Procedures (including critical care time)  Medications Ordered in UC Medications - No data to display  Initial Impression / Assessment and Plan / UC Course  I have reviewed the triage vital signs and the nursing notes.  Pertinent labs & imaging results that were available during my care of the patient were reviewed by me and considered in my medical decision making (see chart for details).     He does not feel that he needs an injection for pain today.  Augmentin  is sent in for the dental infection and ibuprofen  100 mg is sent in for the pain. Final Clinical Impressions(s) / UC Diagnoses   Final diagnoses:  Dental infection     Discharge Instructions      Take amoxicillin -clavulanate 875 mg--1 tab twice daily with food for 7 days  Take ibuprofen  800 mg--1 tab every 8 hours as needed for pain.       ED Prescriptions     Medication Sig Dispense Auth. Provider   amoxicillin -clavulanate (AUGMENTIN ) 875-125 MG tablet Take 1 tablet by mouth 2 (two) times daily for 7 days. 14 tablet Hailey Stormer K, MD   ibuprofen  (ADVIL ) 800 MG tablet Take 1 tablet (800 mg total) by mouth  every 8 (eight) hours as needed (pain). 21 tablet  Vonna Sharlet POUR, MD      I have reviewed the PDMP during this encounter.   Vonna Sharlet POUR, MD 05/28/24 [phone]
